# Patient Record
Sex: Female | Born: 1975 | Race: White | Hispanic: No | Marital: Single | Smoking: Current every day smoker
Health system: Southern US, Community
[De-identification: ages and names within clinical notes are randomized; demographics above are authoritative.]

## PROBLEM LIST (undated history)

## (undated) DIAGNOSIS — F419 Anxiety disorder, unspecified: Secondary | ICD-10-CM

## (undated) DIAGNOSIS — C801 Malignant (primary) neoplasm, unspecified: Secondary | ICD-10-CM

## (undated) DIAGNOSIS — M5030 Other cervical disc degeneration, unspecified cervical region: Secondary | ICD-10-CM

## (undated) DIAGNOSIS — M797 Fibromyalgia: Secondary | ICD-10-CM

## (undated) DIAGNOSIS — I10 Essential (primary) hypertension: Secondary | ICD-10-CM

## (undated) DIAGNOSIS — G5600 Carpal tunnel syndrome, unspecified upper limb: Secondary | ICD-10-CM

## (undated) HISTORY — PX: OTHER SURGICAL HISTORY: SHX169

## (undated) HISTORY — PX: CHOLECYSTECTOMY: SHX55

## (undated) HISTORY — PX: CERVICAL ABLATION: SHX5771

## (undated) HISTORY — PX: NECK SURGERY: SHX720

---

## 1998-01-09 ENCOUNTER — Emergency Department (HOSPITAL_COMMUNITY): Admission: EM | Admit: 1998-01-09 | Discharge: 1998-01-10 | Payer: Self-pay

## 1998-01-10 ENCOUNTER — Emergency Department (HOSPITAL_COMMUNITY): Admission: EM | Admit: 1998-01-10 | Discharge: 1998-01-10 | Payer: Self-pay | Admitting: Emergency Medicine

## 1998-01-10 ENCOUNTER — Ambulatory Visit (HOSPITAL_COMMUNITY): Admission: RE | Admit: 1998-01-10 | Discharge: 1998-01-10 | Payer: Self-pay

## 1998-01-11 ENCOUNTER — Observation Stay (HOSPITAL_COMMUNITY): Admission: RE | Admit: 1998-01-11 | Discharge: 1998-01-12 | Payer: Self-pay | Admitting: General Surgery

## 1998-02-27 ENCOUNTER — Emergency Department (HOSPITAL_COMMUNITY): Admission: EM | Admit: 1998-02-27 | Discharge: 1998-02-27 | Payer: Self-pay | Admitting: Emergency Medicine

## 1999-11-26 ENCOUNTER — Emergency Department (HOSPITAL_COMMUNITY): Admission: EM | Admit: 1999-11-26 | Discharge: 1999-11-26 | Payer: Self-pay | Admitting: Emergency Medicine

## 2001-02-02 ENCOUNTER — Emergency Department (HOSPITAL_COMMUNITY): Admission: EM | Admit: 2001-02-02 | Discharge: 2001-02-02 | Payer: Self-pay | Admitting: *Deleted

## 2001-12-31 ENCOUNTER — Emergency Department (HOSPITAL_COMMUNITY): Admission: EM | Admit: 2001-12-31 | Discharge: 2001-12-31 | Payer: Self-pay | Admitting: Emergency Medicine

## 2002-11-04 ENCOUNTER — Emergency Department (HOSPITAL_COMMUNITY): Admission: EM | Admit: 2002-11-04 | Discharge: 2002-11-04 | Payer: Self-pay | Admitting: Emergency Medicine

## 2002-11-04 ENCOUNTER — Encounter: Payer: Self-pay | Admitting: Emergency Medicine

## 2002-12-03 ENCOUNTER — Emergency Department (HOSPITAL_COMMUNITY): Admission: EM | Admit: 2002-12-03 | Discharge: 2002-12-04 | Payer: Self-pay | Admitting: Physical Therapy

## 2003-02-28 ENCOUNTER — Emergency Department (HOSPITAL_COMMUNITY): Admission: EM | Admit: 2003-02-28 | Discharge: 2003-02-28 | Payer: Self-pay | Admitting: Emergency Medicine

## 2003-03-05 ENCOUNTER — Encounter: Payer: Self-pay | Admitting: Emergency Medicine

## 2003-03-05 ENCOUNTER — Emergency Department (HOSPITAL_COMMUNITY): Admission: EM | Admit: 2003-03-05 | Discharge: 2003-03-05 | Payer: Self-pay | Admitting: Emergency Medicine

## 2004-03-12 ENCOUNTER — Emergency Department (HOSPITAL_COMMUNITY): Admission: EM | Admit: 2004-03-12 | Discharge: 2004-03-12 | Payer: Self-pay | Admitting: Emergency Medicine

## 2004-04-01 ENCOUNTER — Ambulatory Visit: Payer: Self-pay | Admitting: Family Medicine

## 2004-06-29 ENCOUNTER — Inpatient Hospital Stay (HOSPITAL_COMMUNITY): Admission: AD | Admit: 2004-06-29 | Discharge: 2004-06-29 | Payer: Self-pay | Admitting: Obstetrics and Gynecology

## 2004-07-02 ENCOUNTER — Ambulatory Visit: Payer: Self-pay | Admitting: Family Medicine

## 2004-07-04 ENCOUNTER — Ambulatory Visit: Payer: Self-pay | Admitting: Family Medicine

## 2004-07-18 ENCOUNTER — Ambulatory Visit: Payer: Self-pay | Admitting: Family Medicine

## 2004-07-18 ENCOUNTER — Ambulatory Visit (HOSPITAL_COMMUNITY): Admission: RE | Admit: 2004-07-18 | Discharge: 2004-07-18 | Payer: Self-pay | Admitting: Family Medicine

## 2004-07-21 ENCOUNTER — Ambulatory Visit: Payer: Self-pay | Admitting: Family Medicine

## 2004-07-24 ENCOUNTER — Ambulatory Visit: Payer: Self-pay | Admitting: Family Medicine

## 2004-10-01 ENCOUNTER — Ambulatory Visit: Payer: Self-pay | Admitting: Family Medicine

## 2004-10-29 ENCOUNTER — Ambulatory Visit: Payer: Self-pay | Admitting: Family Medicine

## 2008-03-07 ENCOUNTER — Ambulatory Visit: Payer: Self-pay | Admitting: Family Medicine

## 2008-03-07 DIAGNOSIS — N390 Urinary tract infection, site not specified: Secondary | ICD-10-CM

## 2008-03-07 LAB — CONVERTED CEMR LAB
Bilirubin Urine: NEGATIVE
Glucose, Urine, Semiquant: NEGATIVE
Ketones, urine, test strip: NEGATIVE
Protein, U semiquant: 30

## 2008-03-08 ENCOUNTER — Encounter (INDEPENDENT_AMBULATORY_CARE_PROVIDER_SITE_OTHER): Payer: Self-pay | Admitting: Family Medicine

## 2008-03-12 ENCOUNTER — Telehealth (INDEPENDENT_AMBULATORY_CARE_PROVIDER_SITE_OTHER): Payer: Self-pay | Admitting: *Deleted

## 2008-03-13 ENCOUNTER — Ambulatory Visit: Payer: Self-pay | Admitting: Family Medicine

## 2008-03-13 DIAGNOSIS — L255 Unspecified contact dermatitis due to plants, except food: Secondary | ICD-10-CM

## 2014-11-02 ENCOUNTER — Encounter (HOSPITAL_COMMUNITY): Payer: Self-pay | Admitting: Emergency Medicine

## 2014-11-02 ENCOUNTER — Emergency Department (HOSPITAL_COMMUNITY): Payer: Medicaid Other

## 2014-11-02 ENCOUNTER — Emergency Department (HOSPITAL_COMMUNITY)
Admission: EM | Admit: 2014-11-02 | Discharge: 2014-11-02 | Disposition: A | Discharge status: Home / Self Care | Payer: Medicaid Other | Attending: Emergency Medicine | Admitting: Emergency Medicine

## 2014-11-02 DIAGNOSIS — Z8669 Personal history of other diseases of the nervous system and sense organs: Secondary | ICD-10-CM | POA: Insufficient documentation

## 2014-11-02 DIAGNOSIS — M25562 Pain in left knee: Secondary | ICD-10-CM

## 2014-11-02 HISTORY — DX: Carpal tunnel syndrome, unspecified upper limb: G56.00

## 2014-11-02 HISTORY — DX: Fibromyalgia: M79.7

## 2014-11-02 MED ORDER — OXYCODONE-ACETAMINOPHEN 5-325 MG PO TABS
1.0000 | ORAL_TABLET | Freq: Once | ORAL | Status: AC
  Administered 2014-11-02: 1 via ORAL
  Filled 2014-11-02: qty 1

## 2014-11-02 MED ORDER — OXYCODONE-ACETAMINOPHEN 5-325 MG PO TABS: ORAL_TABLET | ORAL | Status: DC

## 2014-11-02 NOTE — ED Notes (Addendum)
Up to bathroom. Pt hopped to toilet.

## 2014-11-02 NOTE — ED Notes (Signed)
Pt c/o left knee pain x several days after getting down out of high truck

## 2014-11-02 NOTE — ED Notes (Signed)
Called xray to check when patient will go for films.   They advised she is next and someone should be here for her soon.

## 2014-11-02 NOTE — Discharge Instructions (Signed)
Take percocet for breakthrough pain, do not drink alcohol, drive, care for children or do other critical tasks while taking percocet. ° °Please follow with your primary care doctor in the next 2 days for a check-up. They must obtain records for further management.  ° °Do not hesitate to return to the Emergency Department for any new, worsening or concerning symptoms.  ° °

## 2014-11-02 NOTE — ED Provider Notes (Signed)
CSN: [id number]     Arrival date & time 11/02/14  1236 History  This chart was scribed for non-physician practitioner Monico Blitz, PA working with Davonna Belling, MD, by Eustaquio Maize, ED Scribe. This patient was seen in room TR11C/TR11C and the patient's care was started at 1:28 PM.     Chief Complaint  Patient presents with  . Knee Pain   The history is provided by the patient. No language interpreter was used.     HPI Comments: Tracey Lee is a 39 y.o. female who presents to the Emergency Department complaining of constant, severe left knee pain that began approximately 1 week ago. She rates the pain as an 8/10 on the pain scale.  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 The pain is exacerbated upon baring weight. Pt is able to bend knee without difficulty. She has been taking Tylenol and Ibuprofen without relief. She has also been elevating and applying ice to the area which she states has not alleviated pain. She mentions that she was sweating 1 day ago due to the severity in pain. Pt denies fever, chills, or any other symptoms.    Past Medical History  Diagnosis Date  . Fibromyalgia   . Carpal tunnel syndrome    History reviewed. No pertinent past surgical history. History reviewed. No pertinent family history. History  Substance Use Topics  . Smoking status: Never Smoker   . Smokeless tobacco: Not on file  . Alcohol Use: No   OB History    No data available     Review of Systems  A complete 10 system review of systems was obtained and all systems are negative except as noted in the HPI and PMH.    Allergies  Review of patient's allergies indicates no known allergies.  Home Medications   Prior to Admission medications   Medication Sig Start Date End Date Taking? Authorizing Provider  oxyCODONE-acetaminophen (PERCOCET/ROXICET) 5-325 MG  per tablet 1 to 2 tabs PO q6hrs  PRN for pain 11/02/14   Monico Blitz, PA-C   Triage Vitals: BP 138/82 mmHg  Pulse 75  Temp(Src) 98.1 F (36.7 C) (Oral)  Resp 20  SpO2 97%   Physical Exam  Constitutional: She is oriented to person, place, and time. She appears well-developed and well-nourished. No distress.  HENT:  Head: Normocephalic.  Eyes: Conjunctivae and EOM are normal.  Cardiovascular: Normal rate.   Pulmonary/Chest: Effort normal. No stridor.  Musculoskeletal: Normal range of motion.  Left knee:  No deformity, erythema or abrasions. FROM. No effusion or crepitance. Anterior and posterior drawer show no abnormal laxity. Stable to valgus and varus stress. Joint lines are non-tender. Neurovascularly intact. Pt ambulates with antalgic gait.    Neurological: She is alert and oriented to person, place, and time.  Psychiatric: She has a normal mood and affect.  Nursing note and vitals reviewed.   ED Course  Procedures (including critical care time)  DIAGNOSTIC STUDIES: Oxygen Saturation is 97% on RA, normal by my interpretation.    COORDINATION OF CARE: 1:30 PM-Discussed treatment plan which includes DG L Knee, crutches, and knee immobilizer with pt at bedside and pt agreed to plan.   Labs Review Labs Reviewed - No data to display  Imaging Review Dg Knee Complete 4 Views Left  11/02/2014   CLINICAL DATA:  Left knee injury approximately 1 week  ago when the patient jumped down from a truck bed. Pain and swelling which began approximately 2 days ago. Initial encounter.  EXAM: LEFT KNEE - COMPLETE 4+ VIEW  COMPARISON:  None.  FINDINGS: No evidence of acute or subacute fracture or dislocation. Mild medial compartment joint space narrowing and associated hypertrophic spurring. Patellofemoral and lateral compartment joint spaces well preserved. Minimal enthesopathic spurring at the insertion of the patellar tendon on the inferior patella. No visible joint effusion.  IMPRESSION: No  acute or subacute osseous abnormality. Mild medial compartment osteoarthritis.   Electronically Signed   By: Evangeline Dakin M.D.   On: 11/02/2014 15:03     EKG Interpretation None      MDM   Final diagnoses:  Left knee pain    Filed Vitals:   11/02/14 1252 11/02/14 1533  BP: 138/82 108/46  Pulse: 75 59  Temp: 98.1 F (36.7 C)   TempSrc: Oral   Resp: 20 18  SpO2: 97% 100%    Medications  oxyCODONE-acetaminophen (PERCOCET/ROXICET) 5-325 MG per tablet 1 tablet (1 tablet Oral Given 11/02/14 1343)    Tracey Lee is a pleasant 39 y.o. female presenting with atraumatic knee pain. Neurovascularly intact with mild tenderness to palpation. X-rays negative. Will treat with rest, ice, compression elevation, NSAIDS. Patient given crutches and an orthopedic follow-up. Patient given crutches and knee immobilizer.  Evaluation does not show pathology that would require ongoing emergent intervention or inpatient treatment. Pt is hemodynamically stable and mentating appropriately. Discussed findings and plan with patient/guardian, who agrees with care plan. All questions answered. Return precautions discussed and outpatient follow up given.   Discharge Medication List as of 11/02/2014  3:17 PM    START taking these medications   Details  oxyCODONE-acetaminophen (PERCOCET/ROXICET) 5-325 MG per tablet 1 to 2 tabs PO q6hrs  PRN for pain, Print         I personally performed the services described in this documentation, which was scribed in my presence. The recorded information has been reviewed and is accurate.      Monico Blitz, PA-C 11/02/14 2313  Davonna Belling, MD 11/05/14 [phone]

## 2014-11-20 ENCOUNTER — Inpatient Hospital Stay (HOSPITAL_COMMUNITY)
Admission: AD | Admit: 2014-11-20 | Discharge: 2014-11-20 | Disposition: A | Discharge status: Home / Self Care | Payer: Self-pay | Source: Ambulatory Visit | Attending: Family Medicine | Admitting: Family Medicine

## 2014-11-20 ENCOUNTER — Encounter (HOSPITAL_COMMUNITY): Payer: Self-pay | Admitting: *Deleted

## 2014-11-20 ENCOUNTER — Encounter (HOSPITAL_COMMUNITY): Payer: Self-pay | Admitting: Emergency Medicine

## 2014-11-20 DIAGNOSIS — I10 Essential (primary) hypertension: Secondary | ICD-10-CM | POA: Insufficient documentation

## 2014-11-20 DIAGNOSIS — Z8739 Personal history of other diseases of the musculoskeletal system and connective tissue: Secondary | ICD-10-CM | POA: Insufficient documentation

## 2014-11-20 DIAGNOSIS — Y9389 Activity, other specified: Secondary | ICD-10-CM | POA: Insufficient documentation

## 2014-11-20 DIAGNOSIS — Y92009 Unspecified place in unspecified non-institutional (private) residence as the place of occurrence of the external cause: Secondary | ICD-10-CM | POA: Insufficient documentation

## 2014-11-20 DIAGNOSIS — S01311A Laceration without foreign body of right ear, initial encounter: Secondary | ICD-10-CM

## 2014-11-20 DIAGNOSIS — W458XXA Other foreign body or object entering through skin, initial encounter: Secondary | ICD-10-CM | POA: Insufficient documentation

## 2014-11-20 DIAGNOSIS — Y998 Other external cause status: Secondary | ICD-10-CM | POA: Insufficient documentation

## 2014-11-20 DIAGNOSIS — X58XXXA Exposure to other specified factors, initial encounter: Secondary | ICD-10-CM | POA: Insufficient documentation

## 2014-11-20 DIAGNOSIS — S01321A Laceration with foreign body of right ear, initial encounter: Secondary | ICD-10-CM | POA: Insufficient documentation

## 2014-11-20 DIAGNOSIS — Z8659 Personal history of other mental and behavioral disorders: Secondary | ICD-10-CM | POA: Insufficient documentation

## 2014-11-20 NOTE — Discharge Instructions (Signed)
Facial Laceration °A facial laceration is a cut on the face. These injuries can be painful and cause bleeding. Some cuts may need to be closed with stitches (sutures), skin adhesive strips, or wound glue. Cuts usually heal quickly but can leave a scar. It can take 1-2 years for the scar to go away completely. °HOME CARE  °· Only take medicines as told by your doctor. °· Follow your doctor's instructions for wound care. °For Stitches: °· Keep the cut clean and dry. °· If you have a bandage (dressing), change it at least once a day. Change the bandage if it gets wet or dirty, or as told by your doctor. °· Wash the cut with soap and water 2 times a day. Rinse the cut with water. Pat it dry with a clean towel. °· Put a thin layer of medicated cream on the cut as told by your doctor. °· You may shower after the first 24 hours. Do not soak the cut in water until the stitches are removed. °· Have your stitches removed as told by your doctor. °· Do not wear any makeup until a few days after your stitches are removed. °For Skin Adhesive Strips: °· Keep the cut clean and dry. °· Do not get the strips wet. You may take a bath, but be careful to keep the cut dry. °· If the cut gets wet, pat it dry with a clean towel. °· The strips will fall off on their own. Do not remove the strips that are still stuck to the cut. °For Wound Glue: °· You may shower or take baths. Do not soak or scrub the cut. Do not swim. Avoid heavy sweating until the glue falls off on its own. After a shower or bath, pat the cut dry with a clean towel. °· Do not put medicine or makeup on your cut until the glue falls off. °· If you have a bandage, do not put tape over the glue. °· Avoid lots of sunlight or tanning lamps until the glue falls off. °· The glue will fall off on its own in 5-10 days. Do not pick at the glue. °After Healing: °Put sunscreen on the cut for the first year to reduce your scar. °GET HELP RIGHT AWAY IF:  °· Your cut area gets red,  painful, or puffy (swollen). °· You see a yellowish-white fluid (pus) coming from the cut. °· You have chills or a fever. °MAKE SURE YOU:  °· Understand these instructions. °· Will watch your condition. °· Will get help right away if you are not doing well or get worse. °Document Released: 12/16/2007 Document Revised: 04/19/2013 Document Reviewed: 02/09/2013 °[employer]® Patient Information ©2015 [employer]. This information is not intended to replace advice given to you by your health care provider. Make sure you discuss any questions you have with your health care provider. ° °

## 2014-11-20 NOTE — MAU Provider Note (Signed)
Ms. Tracey Lee is a 39 y.o. [phone] who presents to [facility]U today with complaint of a laceration of the right ear lobe. The patient states that she was moving her hair away from her ear and it got caught in her earring and pulled the earring which "ripped" her earring out. She states moderate to severe pain. She put neosporin on the area which caused some pain as well. She states significant bleeding noted at first which has now resolved. Patient denies fever or any other concerns.   ROS:  All pertinent ROS noted in HPI, all other ROS negative  BP 127/85 mmHg  Pulse 84  Temp(Src) 98.6 F (37 C) (Oral)  Resp 20  Ht 5\' 4"  (1.626 m)  Wt 279 lb 6 oz (126.724 kg)  BMI 47.93 kg/m2  CONSTITUTIONAL: Well-developed, well-nourished female in no acute distress.  ENT: External right and left ear normal.  EYES: EOM intact, conjunctivae normal.  MUSCULOSKELETAL: Normal range of motion.  CARDIOVASCULAR: Regular heart rate RESPIRATORY: Normal effort NEUROLOGICAL: Alert and oriented to person, place, and time.  SKIN: Skin is warm and dry. No rash noted. Not diaphoretic. No erythema. No pallor. PSYCH: Normal mood and affect. Normal behavior. Normal judgment and thought content.  A: Laceration of the right earlobe  P: Discharge patient in stable condition Patient advised to follow-up with Urgent Care, [facility] or [facility] for further evaluation and possible suturing of the laceration if they deem it necessary  Luvenia Redden, PA-C 11/20/2014 9:28 PM

## 2014-11-20 NOTE — ED Notes (Signed)
Pt. presents with laceration at right earlobe sustained this evening after her earring accidentally pulled at home . Bleeding controlled.

## 2014-11-20 NOTE — MAU Note (Addendum)
PT SAYS    AT 630PM-  HER HAIR  GOT CAUGHT IN HER HOOP EARRING-    AND  WHILE  UNTANGLING-    -IT RIPPED  HER EARLOBE  COMPLETELY THROUGH.    ON ARRIVAL-   NO BLEEDING.

## 2014-11-21 ENCOUNTER — Emergency Department (HOSPITAL_COMMUNITY)
Admission: EM | Admit: 2014-11-21 | Discharge: 2014-11-21 | Disposition: A | Discharge status: Home / Self Care | Payer: Medicaid Other | Attending: Emergency Medicine | Admitting: Emergency Medicine

## 2014-11-21 DIAGNOSIS — S01311A Laceration without foreign body of right ear, initial encounter: Secondary | ICD-10-CM

## 2014-11-21 HISTORY — DX: Essential (primary) hypertension: I10

## 2014-11-21 HISTORY — DX: Anxiety disorder, unspecified: F41.9

## 2014-11-21 MED ORDER — LIDOCAINE HCL (PF) 1 % IJ SOLN
10.0000 mL | Freq: Once | INTRAMUSCULAR | Status: AC
  Administered 2014-11-21: 10 mL via INTRADERMAL
  Filled 2014-11-21: qty 10

## 2014-11-21 NOTE — Discharge Instructions (Signed)
1. Medications: Tylenol or ibuprofen for pain, usual home medications 2. Treatment: ice for swelling, keep wound clean with warm soap and water and keep bandage dry, do not submerge in water for 24 hours 3. Follow Up: Please see your PCP in 5 days for wound check or sooner if you have concerns. Return to the emergency department for increased redness, drainage of pus from the wound   WOUND CARE  Keep area clean and dry for 24 hours. Do not remove bandage, if applied.  After 24 hours, remove bandage and wash wound gently with mild soap and warm water. Reapply a new bandage after cleaning wound, if directed.   Continue daily cleansing with soap and water until stitches/staples are removed.  Do not apply any ointments or creams to the wound while stitches/staples are in place, as this may cause delayed healing. Return if you experience any of the following signs of infection: Swelling, redness, pus drainage, streaking, fever >101.0 F  Return if you experience excessive bleeding that does not stop after 15-20 minutes of constant, firm pressure.

## 2014-11-21 NOTE — ED Provider Notes (Signed)
CSN: [id number]     Arrival date & time 11/20/14  2322 History   First MD Initiated Contact with Patient 11/21/14 0018     Chief Complaint  Patient presents with  . Ear Laceration     (Consider location/radiation/quality/duration/timing/severity/associated sxs/prior Treatment) The history is provided by the patient and medical records. No language interpreter was used.     Tracey Lee is a 39 y.o. female  with a hx of fibromyalgia, HTN, anxiety presents to the Emergency Department complaining of acute, persistent laceration to the right ear. Patient reports she was wearing hoop earrings tonight when her hair got wrapped around it and she accidentally ripped out of her ear. Patient reports this occurred around 6 PM tonight.  She reports that she cleaned the wound and the bleeding stopped however an OB/GYN told her that she needed sutures. She denies fever, chills, nausea, vomiting.  Eyes history of steroid uses, poor wound healing or diabetes.    Past Medical History  Diagnosis Date  . Fibromyalgia   . Carpal tunnel syndrome   . Hypertension   . Anxiety    Past Surgical History  Procedure Laterality Date  . Cholecystectomy     No family history on file. History  Substance Use Topics  . Smoking status: Never Smoker   . Smokeless tobacco: Not on file  . Alcohol Use: No   OB History    Gravida Para Term Preterm AB TAB SAB Ectopic Multiple Living   4 2 2  1  1   3      Review of Systems  Constitutional: Negative for fever.  Gastrointestinal: Negative for nausea and vomiting.  Skin: Positive for wound.  Allergic/Immunologic: Negative for immunocompromised state.  Neurological: Negative for weakness and numbness.  Hematological: Does not bruise/bleed easily.  Psychiatric/Behavioral: The patient is not nervous/anxious.       Allergies  Review of patient's allergies indicates no known allergies.  Home Medications   Prior to Admission medications   Medication Sig  Start Date End Date Taking? Authorizing Provider  diphenhydrAMINE (BENADRYL) 25 MG tablet Take 25 mg by mouth daily as needed for allergies.    Historical Provider, MD  ibuprofen (ADVIL,MOTRIN) 200 MG tablet Take 600 mg by mouth every 6 (six) hours as needed for mild pain.    Historical Provider, MD  Multiple Vitamin (MULTIVITAMIN WITH MINERALS) TABS tablet Take 1 tablet by mouth daily.    Historical Provider, MD  oxyCODONE-acetaminophen (PERCOCET/ROXICET) 5-325 MG per tablet 1 to 2 tabs PO q6hrs  PRN for pain Patient taking differently: Take 1-2 tablets by mouth every 6 (six) hours as needed for moderate pain.  11/02/14   Nicole Pisciotta, PA-C   BP 149/91 mmHg  Pulse 97  Temp(Src) 98.2 F (36.8 C) (Oral)  Resp 14  Ht 5\' 5"  (1.651 m)  Wt 280 lb (127.007 kg)  BMI 46.59 kg/m2  SpO2 99% Physical Exam  Constitutional: She is oriented to person, place, and time. She appears well-developed and well-nourished. No distress.  HENT:  Head: Normocephalic and atraumatic.  0.75 cm laceration to the right ear lobe, hemostasis achieved -  Eyes: Conjunctivae are normal. No scleral icterus.  Neck: Normal range of motion.  Cardiovascular: Normal rate, regular rhythm, normal heart sounds and intact distal pulses.   No murmur heard. Capillary refill < 3 sec  Pulmonary/Chest: Effort normal and breath sounds normal. No respiratory distress.  Musculoskeletal: Normal range of motion. She exhibits no edema.  Neurological: She is alert and  oriented to person, place, and time.  Skin: Skin is warm and dry. She is not diaphoretic.  Psychiatric: She has a normal mood and affect.  Nursing note and vitals reviewed.   ED Course  LACERATION REPAIR Date/Time: 11/21/2014 1:23 AM Performed by: Abigail Butts Authorized by: Abigail Butts Consent: Verbal consent obtained. Risks and benefits: risks, benefits and alternatives were discussed Consent given by: patient Patient understanding: patient states  understanding of the procedure being performed Patient consent: the patient's understanding of the procedure matches consent given Procedure consent: procedure consent matches procedure scheduled Relevant documents: relevant documents present and verified Site marked: the operative site was marked Required items: required blood products, implants, devices, and special equipment available Patient identity confirmed: verbally with patient and arm band Time out: Immediately prior to procedure a "time out" was called to verify the correct patient, procedure, equipment, support staff and site/side marked as required. Body area: head/neck Location details: right ear Laceration length: 0.8 cm Foreign bodies: no foreign bodies Tendon involvement: none Nerve involvement: none Vascular damage: no Anesthesia: local infiltration Local anesthetic: lidocaine 1% without epinephrine Anesthetic total: 2 ml Patient sedated: no Preparation: Patient was prepped and draped in the usual sterile fashion. Irrigation solution: saline Irrigation method: syringe Amount of cleaning: extensive Debridement: none Degree of undermining: none Wound skin closure material used: 4-0 chronic gut. Number of sutures: 3 Technique: simple Approximation: close Dressing: 4x4 sterile gauze Patient tolerance: Patient tolerated the procedure well with no immediate complications   (including critical care time) Labs Review Labs Reviewed - No data to display  Imaging Review No results found.   EKG Interpretation None      MDM   Final diagnoses:  Laceration of earlobe, right, initial encounter   Tracey Lee presents with laceration to the right earlobe after pulling out her earring. Initial pierced hole is well healed and only a small section was lacerated. Discussed that this does not usually require sutures however patient requested sutures be placed in an attempt to cosmetically repair the tear.  His  custom length that I do not believe this will improve healing however we will place sutures.  Tetanus updated. Sutures placed without difficulty. Patient discharged home with wound care constructions.  BP 149/91 mmHg  Pulse 97  Temp(Src) 98.2 F (36.8 C) (Oral)  Resp 14  Ht 5\' 5"  (1.651 m)  Wt 280 lb (127.007 kg)  BMI 46.59 kg/m2  SpO2 99%     Abigail Butts, PA-C 11/21/14 0128  Linton Flemings, MD 11/21/14 [phone]

## 2014-11-21 NOTE — ED Notes (Addendum)
Pt sts she woke up on the couch tonight and was wearing a pair of hoop earrings.  Pt sts her hair got wrapped around it and pt was still sleepy attempting to pull the hair out of the earring and ripped the earring out of her ear.  Pt noted blood and realized she had split her earlobe from the piercing hole to the tip of the lobe.

## 2015-08-31 ENCOUNTER — Encounter (HOSPITAL_COMMUNITY): Payer: Self-pay | Admitting: Emergency Medicine

## 2015-08-31 ENCOUNTER — Emergency Department (HOSPITAL_COMMUNITY)
Admission: EM | Admit: 2015-08-31 | Discharge: 2015-08-31 | Disposition: A | Discharge status: Home / Self Care | Payer: Self-pay | Attending: Emergency Medicine | Admitting: Emergency Medicine

## 2015-08-31 ENCOUNTER — Emergency Department (HOSPITAL_COMMUNITY): Payer: Self-pay

## 2015-08-31 DIAGNOSIS — Z79899 Other long term (current) drug therapy: Secondary | ICD-10-CM | POA: Insufficient documentation

## 2015-08-31 DIAGNOSIS — Y990 Civilian activity done for income or pay: Secondary | ICD-10-CM | POA: Insufficient documentation

## 2015-08-31 DIAGNOSIS — Z8669 Personal history of other diseases of the nervous system and sense organs: Secondary | ICD-10-CM | POA: Insufficient documentation

## 2015-08-31 DIAGNOSIS — M1711 Unilateral primary osteoarthritis, right knee: Secondary | ICD-10-CM | POA: Insufficient documentation

## 2015-08-31 DIAGNOSIS — W228XXA Striking against or struck by other objects, initial encounter: Secondary | ICD-10-CM | POA: Insufficient documentation

## 2015-08-31 DIAGNOSIS — I10 Essential (primary) hypertension: Secondary | ICD-10-CM | POA: Insufficient documentation

## 2015-08-31 DIAGNOSIS — Y9389 Activity, other specified: Secondary | ICD-10-CM | POA: Insufficient documentation

## 2015-08-31 DIAGNOSIS — Y9289 Other specified places as the place of occurrence of the external cause: Secondary | ICD-10-CM | POA: Insufficient documentation

## 2015-08-31 DIAGNOSIS — M25561 Pain in right knee: Secondary | ICD-10-CM

## 2015-08-31 DIAGNOSIS — S8001XA Contusion of right knee, initial encounter: Secondary | ICD-10-CM | POA: Insufficient documentation

## 2015-08-31 DIAGNOSIS — Z8659 Personal history of other mental and behavioral disorders: Secondary | ICD-10-CM | POA: Insufficient documentation

## 2015-08-31 DIAGNOSIS — E669 Obesity, unspecified: Secondary | ICD-10-CM | POA: Insufficient documentation

## 2015-08-31 DIAGNOSIS — S86911A Strain of unspecified muscle(s) and tendon(s) at lower leg level, right leg, initial encounter: Secondary | ICD-10-CM | POA: Insufficient documentation

## 2015-08-31 MED ORDER — NAPROXEN 500 MG PO TABS: 500.0000 mg | ORAL_TABLET | Freq: Two times a day (BID) | ORAL | Status: DC | PRN

## 2015-08-31 MED ORDER — HYDROCODONE-ACETAMINOPHEN 5-325 MG PO TABS: 1.0000 | ORAL_TABLET | Freq: Four times a day (QID) | ORAL | Status: DC | PRN

## 2015-08-31 MED ORDER — HYDROCODONE-ACETAMINOPHEN 5-325 MG PO TABS
1.0000 | ORAL_TABLET | Freq: Once | ORAL | Status: AC
  Administered 2015-08-31: 1 via ORAL
  Filled 2015-08-31: qty 1

## 2015-08-31 NOTE — ED Notes (Signed)
Pt reports right knee pain , denies fall nor injury. Hx arthritis and left knee issues in past.

## 2015-08-31 NOTE — Discharge Instructions (Signed)
Wear knee immobilizer/sleeve for at least 2 weeks for stabilization of knee. Use crutches as needed for comfort. Ice and elevate knee throughout the day. Alternate between naprosyn and norco for pain relief. Do not drive or operate machinery with pain medication use. Losing weight will also help your knee pain. Call orthopedic follow up today or tomorrow to schedule followup appointment for recheck of ongoing knee pain in 1-2 weeks that can be canceled with a 24-48 hour notice if complete resolution of pain. Return to the ER for changes or worsening symptoms.    Knee Pain Knee pain is a common problem. It can have many causes. The pain often goes away by following your doctor's home care instructions. Treatment for ongoing pain will depend on the cause of your pain. If your knee pain continues, more tests may be needed to diagnose your condition. Tests may include X-rays or other imaging studies of your knee. HOME CARE  Take medicines only as told by your doctor.  Rest your knee and keep it raised (elevated) while you are resting.  Do not do things that cause pain or make your pain worse.  Avoid activities where both feet leave the ground at the same time, such as running, jumping rope, or doing jumping jacks.  Apply ice to the knee area:  Put ice in a plastic bag.  Place a towel between your skin and the bag.  Leave the ice on for 20 minutes, 2-3 times a day.  Ask your doctor if you should wear an elastic knee support.  Sleep with a pillow under your knee.  Lose weight if you are overweight. Being overweight can make your knee hurt more.  Do not use any tobacco products, including cigarettes, chewing tobacco, or electronic cigarettes. If you need help quitting, ask your doctor. Smoking may slow the healing of any bone and joint problems that you may have. GET HELP IF:  Your knee pain does not stop, it changes, or it gets worse.  You have a fever along with knee pain.  Your knee  gives out or locks up.  Your knee becomes more swollen. GET HELP RIGHT AWAY IF:   Your knee feels hot to the touch.  You have chest pain or trouble breathing.   This information is not intended to replace advice given to you by your health care provider. Make sure you discuss any questions you have with your health care provider.   Document Released: 09/25/2008 Document Revised: 07/20/2014 Document Reviewed: 08/30/2013 Elsevier Interactive Patient Education 2016 Else[employer] Inc[employer]   Osteoarthritis Osteoarthritis is a disease that causes soreness and inflammation of a joint. It occurs when the cartilage at the affected joint wears down. Cartilage acts as a cushion, covering the ends of bones where they meet to form a joint. Osteoarthritis is the most common form of arthritis. It often occurs in older people. The joints affected most often by this condition include those in the:  Ends of the fingers.  Thumbs.  Neck.  Lower back.  Knees.  Hips. CAUSES  Over time, the cartilage that covers the ends of bones begins to wear away. This causes bone to rub on bone, producing pain and stiffness in the affected joints.  RISK FACTORS Certain factors can increase your chances of having osteoarthritis, including:  Older age.  Excessive body weight.  Overuse of joints.  Previous joint injury. SIGNS AND SYMPTOMS   Pain, swelling, and stiffness in the joint.  Over time, the joint may lose  its normal shape.  Small deposits of bone (osteophytes) may grow on the edges of the joint.  Bits of bone or cartilage can break off and float inside the joint space. This may cause more pain and damage. DIAGNOSIS  Your health care provider will do a physical exam and ask about your symptoms. Various tests may be ordered, such as:  X-rays of the affected joint.  Blood tests to rule out other types of arthritis. Additional tests may be used to diagnose your condition. TREATMENT  Goals of  treatment are to control pain and improve joint function. Treatment plans may include:  A prescribed exercise program that allows for rest and joint relief.  A weight control plan.  Pain relief techniques, such as:  Properly applied heat and cold.  Electric pulses delivered to nerve endings under the skin (transcutaneous electrical nerve stimulation [TENS]).  Massage.  Certain nutritional supplements.  Medicines to control pain, such as:  Acetaminophen.  Nonsteroidal anti-inflammatory drugs (NSAIDs), such as naproxen.  Narcotic or central-acting agents, such as tramadol.  Corticosteroids. These can be given orally or as an injection.  Surgery to reposition the bones and relieve pain (osteotomy) or to remove loose pieces of bone and cartilage. Joint replacement may be needed in advanced states of osteoarthritis. HOME CARE INSTRUCTIONS   Take medicines only as directed by your health care provider.  Maintain a healthy weight. Follow your health care provider's instructions for weight control. This may include dietary instructions.  Exercise as directed. Your health care provider can recommend specific types of exercise. These may include:  Strengthening exercises. These are done to strengthen the muscles that support joints affected by arthritis. They can be performed with weights or with exercise bands to add resistance.  Aerobic activities. These are exercises, such as brisk walking or low-impact aerobics, that get your heart pumping.  Range-of-motion activities. These keep your joints limber.  Balance and agility exercises. These help you maintain daily living skills.  Rest your affected joints as directed by your health care provider.  Keep all follow-up visits as directed by your health care provider. SEEK MEDICAL CARE IF:   Your skin turns red.  You develop a rash in addition to your joint pain.  You have worsening joint pain.  You have a fever along with  joint or muscle aches. SEEK IMMEDIATE MEDICAL CARE IF:  You have a significant loss of weight or appetite.  You have night sweats. [place] of Arthritis and Musculoskeletal and Skin Diseases: www.niams.SouthExposed.es  [employer] on Aging: http://kim-miller.com/  American College of Rheumatology: www.rheumatology.org   This information is not intended to replace advice given to you by your health care provider. Make sure you discuss any questions you have with your health care provider.   Document Released: 06/29/2005 Document Revised: 07/20/2014 Document Reviewed: 03/06/2013 Elsevier Interactive Patient Education 2016 [place] A contusion is a deep bruise. Contusions are the result of a blunt injury to tissues and muscle fibers under the skin. The injury causes bleeding under the skin. The skin overlying the contusion may turn blue, purple, or yellow. Minor injuries will give you a painless contusion, but more severe contusions may stay painful and swollen for a few weeks.  CAUSES  This condition is usually caused by a blow, trauma, or direct force to an area of the body. SYMPTOMS  Symptoms of this condition include:  Swelling of the injured area.  Pain and tenderness in the injured  area.  Discoloration. The area may have redness and then turn blue, purple, or yellow. DIAGNOSIS  This condition is diagnosed based on a physical exam and medical history. An X-ray, CT scan, or MRI may be needed to determine if there are any associated injuries, such as broken bones (fractures). TREATMENT  Specific treatment for this condition depends on what area of the body was injured. In general, the best treatment for a contusion is resting, icing, applying pressure to (compression), and elevating the injured area. This is often called the RICE strategy. Over-the-counter anti-inflammatory medicines may also be recommended for pain control.  HOME CARE  INSTRUCTIONS   Rest the injured area.  If directed, apply ice to the injured area:  Put ice in a plastic bag.  Place a towel between your skin and the bag.  Leave the ice on for 20 minutes, 2-3 times per day.  If directed, apply light compression to the injured area using an elastic bandage. Make sure the bandage is not wrapped too tightly. Remove and reapply the bandage as directed by your health care provider.  If possible, raise (elevate) the injured area above the level of your heart while you are sitting or lying down.  Take over-the-counter and prescription medicines only as told by your health care provider. SEEK MEDICAL CARE IF:  Your symptoms do not improve after several days of treatment.  Your symptoms get worse.  You have difficulty moving the injured area. SEEK IMMEDIATE MEDICAL CARE IF:   You have severe pain.  You have numbness in a hand or foot.  Your hand or foot turns pale or cold.   This information is not intended to replace advice given to you by your health care provider. Make sure you discuss any questions you have with your health care provider.   Document Released: 04/08/2005 Document Revised: 03/20/2015 Document Reviewed: 11/14/2014 Elsevier Interactive Patient Education 2016 [place].  Cryotherapy Cryotherapy means treatment with cold. Ice or gel packs can be used to reduce both pain and swelling. Ice is the most helpful within the first 24 to 48 hours after an injury or flare-up from overusing a muscle or joint. Sprains, strains, spasms, burning pain, shooting pain, and aches can all be eased with ice. Ice can also be used when recovering from surgery. Ice is effective, has very few side effects, and is safe for most people to use. PRECAUTIONS  Ice is not a safe treatment option for people with:  Raynaud phenomenon. This is a condition affecting small blood vessels in the extremities. Exposure to cold may cause your problems to  return.  Cold hypersensitivity. There are many forms of cold hypersensitivity, including:  Cold urticaria. Red, itchy hives appear on the skin when the tissues begin to warm after being iced.  Cold erythema. This is a red, itchy rash caused by exposure to cold.  Cold hemoglobinuria. Red blood cells break down when the tissues begin to warm after being iced. The hemoglobin that carry oxygen are passed into the urine because they cannot combine with blood proteins fast enough.  Numbness or altered sensitivity in the area being iced. If you have any of the following conditions, do not use ice until you have discussed cryotherapy with your caregiver:  Heart conditions, such as arrhythmia, angina, or chronic heart disease.  High blood pressure.  Healing wounds or open skin in the area being iced.  Current infections.  Rheumatoid arthritis.  Poor circulation.  Diabetes. Ice slows the blood flow  in the region it is applied. This is beneficial when trying to stop inflamed tissues from spreading irritating chemicals to surrounding tissues. However, if you expose your skin to cold temperatures for too long or without the proper protection, you can damage your skin or nerves. Watch for signs of skin damage due to cold. HOME CARE INSTRUCTIONS Follow these tips to use ice and cold packs safely.  Place a dry or damp towel between the ice and skin. A damp towel will cool the skin more quickly, so you may need to shorten the time that the ice is used.  For a more rapid response, add gentle compression to the ice.  Ice for no more than 10 to 20 minutes at a time. The bonier the area you are icing, the less time it will take to get the benefits of ice.  Check your skin after 5 minutes to make sure there are no signs of a poor response to cold or skin damage.  Rest 20 minutes or more between uses.  Once your skin is numb, you can end your treatment. You can test numbness by very lightly touching  your skin. The touch should be so light that you do not see the skin dimple from the pressure of your fingertip. When using ice, most people will feel these normal sensations in this order: cold, burning, aching, and numbness.  Do not use ice on someone who cannot communicate their responses to pain, such as small children or people with dementia. HOW TO MAKE AN ICE PACK Ice packs are the most common way to use ice therapy. Other methods include ice massage, ice baths, and cryosprays. Muscle creams that cause a cold, tingly feeling do not offer the same benefits that ice offers and should not be used as a substitute unless recommended by your caregiver. To make an ice pack, do one of the following:  Place crushed ice or a bag of frozen vegetables in a sealable plastic bag. Squeeze out the excess air. Place this bag inside another plastic bag. Slide the bag into a pillowcase or place a damp towel between your skin and the bag.  Mix 3 parts water with 1 part rubbing alcohol. Freeze the mixture in a sealable plastic bag. When you remove the mixture from the freezer, it will be slushy. Squeeze out the excess air. Place this bag inside another plastic bag. Slide the bag into a pillowcase or place a damp towel between your skin and the bag. SEEK MEDICAL CARE IF:  You develop white spots on your skin. This may give the skin a blotchy (mottled) appearance.  Your skin turns blue or pale.  Your skin becomes waxy or hard.  Your swelling gets worse. MAKE SURE YOU:   Understand these instructions.  Will watch your condition.  Will get help right away if you are not doing well or get worse.   This information is not intended to replace advice given to you by your health care provider. Make sure you discuss any questions you have with your health care provider.   Document Released: 02/23/2011 Document Revised: 07/20/2014 Document Reviewed: 02/23/2011 Elsevier Interactive Patient Education 2016  [employer].  Obesity Obesity is defined as having too much total body fat and a body mass index (BMI) of 30 or more. BMI is an estimate of body fat and is calculated from your height and weight. BMI is typically calculated by your health care provider during regular wellness visits. Obesity happens when you  consume more calories than you can burn by exercising or performing daily physical tasks. Prolonged obesity can cause major illnesses or emergencies, such as:  Stroke.  Heart disease.  Diabetes.  Cancer.  Arthritis.  High blood pressure (hypertension).  High cholesterol.  Sleep apnea.  Erectile dysfunction.  Infertility problems. CAUSES   Regularly eating unhealthy foods.  Physical inactivity.  Certain disorders, such as an underactive thyroid (hypothyroidism), Cushing's syndrome, and polycystic ovarian syndrome.  Certain medicines, such as steroids, some depression medicines, and antipsychotics.  Genetics.  Lack of sleep. DIAGNOSIS A health care provider can diagnose obesity after calculating your BMI. Obesity will be diagnosed if your BMI is 30 or higher. There are other methods of measuring obesity levels. Some other methods include measuring your skinfold thickness, your waist circumference, and comparing your hip circumference to your waist circumference. TREATMENT  A healthy treatment program includes some or all of the following:  Long-term dietary changes.  Exercise and physical activity.  Behavioral and lifestyle changes.  Medicine only under the supervision of your health care provider. Medicines may help, but only if they are used with diet and exercise programs. If your BMI is 40 or higher, your health care provider may recommend specialized surgery or programs to help with weight loss. An unhealthy treatment program includes:  Fasting.  Fad diets.  Supplements and drugs. These choices do not succeed in long-term weight control. HOME CARE  INSTRUCTIONS  Exercise and perform physical activity as directed by your health care provider. To increase physical activity, try the following:  Use stairs instead of elevators.  Park farther away from store entrances.  Garden, bike, or walk instead of watching television or using the computer.  Eat healthy, low-calorie foods and drinks on a regular basis. Eat more fruits and vegetables. Use low-calorie cookbooks or take healthy cooking classes.  Limit fast food, sweets, and processed snack foods.  Eat smaller portions.  Keep a daily journal of everything you eat. There are many free websites to help you with this. It may be helpful to measure your foods so you can determine if you are eating the correct portion sizes.  Avoid drinking alcohol. Drink more water and drinks without calories.  Take vitamins and supplements only as recommended by your health care provider.  Weight-loss support groups, Tax adviser, counselors, and stress reduction education can also be very helpful. SEEK IMMEDIATE MEDICAL CARE IF:  You have chest pain or tightness.  You have trouble breathing or feel short of breath.  You have weakness or leg numbness.  You feel confused or have trouble talking.  You have sudden changes in your vision.   This information is not intended to replace advice given to you by your health care provider. Make sure you discuss any questions you have with your health care provider.   Document Released: 08/06/2004 Document Revised: 07/20/2014 Document Reviewed: 08/05/2011 Elsevier Interactive Patient Education [employer].

## 2015-08-31 NOTE — Progress Notes (Signed)
Orthopedic Tech Progress Note Patient Details:  Tracey Lee September 02, 1975 [id number]  Patient ID: Tracey Lee, female   DOB: 11/03/1975, 40 y.o.   MRN: [id number] Patient did not want to use crutches over here. She told me she was going to use it when she gets home. Ace was given to her also.  Ladell Pier [facility] 08/31/2015, 4:48 PM

## 2015-08-31 NOTE — ED Provider Notes (Signed)
CSN: [id number]     Arrival date & time 08/31/15  1531 History   First MD Initiated Contact with Patient 08/31/15 1544     Chief Complaint  Patient presents with  . Knee Pain    right     (Consider location/radiation/quality/duration/timing/severity/associated sxs/prior Treatment) HPI Comments: Tracey Lee is a 40 y.o. female with a PMHx of obesity, knee arthritis, fibromyalgia, anxiety, HTN, and carpal tunnel syndrome, who presents to the ED with complaints of right knee pain 1 week. 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 She is The pain as 10/10 throbbing constant right knee pain radiating down into the lower leg, worse with weightbearing, improved with Goody's powders, and unrelieved with ibuprofen. Associated symptoms include swelling. She also states that 2-3 nights ago she hit it at work and has a bruise to the lateral aspect of the knee, but the pain had been ongoing since before this injury. She denies any redness, warmth, or abrasions. She also denies any fevers, chills, chest pain, shortness breath, abdominal pain, nausea, vomiting, diarrhea, constipation, dysuria, hematuria, numbness, tingling, or focal weakness.  Patient is a 40 y.o. female presenting with knee pain. The history is provided by the patient and medical records. No language interpreter was used.  Knee Pain Location:  Knee Time since incident:  1 week Injury: no   Knee location:  R knee Pain details:    Quality:  Throbbing   Radiates to:  R leg   Severity:  Severe   Onset quality:  Gradual   Duration:  1 week   Timing:  Constant   Progression:  Unchanged Chronicity:  Recurrent Prior injury to area:  No Relieved by:  NSAIDs (Goody's powder) Worsened by:  Bearing weight Ineffective treatments:  NSAIDs (ibuprofen) Associated symptoms: swelling   Associated symptoms:  no decreased ROM, no fever, no muscle weakness, no numbness and no tingling   Risk factors: obesity     Past Medical History  Diagnosis Date  . Fibromyalgia   . Carpal tunnel syndrome   . Hypertension   . Anxiety    Past Surgical History  Procedure Laterality Date  . Cholecystectomy     No family history on file. Social History  Substance Use Topics  . Smoking status: Never Smoker   . Smokeless tobacco: None  . Alcohol Use: No   OB History    Gravida Para Term Preterm AB TAB SAB Ectopic Multiple Living   4 2 2  1  1   3      Review of Systems  Constitutional: Negative for fever and chills.  Respiratory: Negative for shortness of breath.   Cardiovascular: Negative for chest pain.  Gastrointestinal: Negative for nausea, vomiting, abdominal pain, diarrhea and constipation.  Genitourinary: Negative for dysuria and hematuria.  Musculoskeletal: Positive for joint swelling and arthralgias. Negative for myalgias.  Skin: Positive for color change. Negative for wound.  Allergic/Immunologic: Negative for immunocompromised state.  Neurological: Negative for weakness and numbness.  Psychiatric/Behavioral: Negative for confusion.   10 Systems reviewed and are negative for acute change except as noted in the HPI.    Allergies  Review of patient's allergies indicates no known allergies.  Home Medications   Prior to Admission medications   Medication Sig Start Date End Date Taking? Authorizing Provider  diphenhydrAMINE (BENADRYL) 25 MG tablet Take 25  mg by mouth daily as needed for allergies.    Historical Provider, MD  ibuprofen (ADVIL,MOTRIN) 200 MG tablet Take 600 mg by mouth every 6 (six) hours as needed for mild pain.    Historical Provider, MD  Multiple Vitamin (MULTIVITAMIN WITH MINERALS) TABS tablet Take 1 tablet by mouth daily.    Historical Provider, MD  oxyCODONE-acetaminophen (PERCOCET/ROXICET) 5-325 MG per tablet 1 to 2 tabs PO q6hrs  PRN for pain Patient taking  differently: Take 1-2 tablets by mouth every 6 (six) hours as needed for moderate pain.  11/02/14   Nicole Pisciotta, PA-C   BP 137/92 mmHg  Pulse 73  Temp(Src) 98.3 F (36.8 C) (Oral)  Resp 16  SpO2 97% Physical Exam  Constitutional: She is oriented to person, place, and time. Vital signs are normal. She appears well-developed and well-nourished.  Non-toxic appearance. No distress.  Afebrile, nontoxic, NAD  HENT:  Head: Normocephalic and atraumatic.  Mouth/Throat: Mucous membranes are normal.  Eyes: Conjunctivae and EOM are normal. Right eye exhibits no discharge. Left eye exhibits no discharge.  Neck: Normal range of motion. Neck supple.  Cardiovascular: Normal rate and intact distal pulses.   Pulmonary/Chest: Effort normal. No respiratory distress.  Abdominal: Normal appearance. She exhibits no distension.  Musculoskeletal: Normal range of motion.       Right knee: She exhibits effusion (?possible, very slight, difficult to palpate due to body habitus) and ecchymosis. She exhibits normal range of motion, no swelling, no deformity, no laceration, no erythema, normal alignment, no LCL laxity, normal patellar mobility and no MCL laxity. Tenderness found. Medial joint line and lateral joint line tenderness noted.       Legs: R knee with FROM intact, with diffuse joint line TTP, no swelling or deformity, ?effusion although difficult to palpate due to body habitus, +bruising to lateral joint line, no erythema, no warmth, no abrasions, no abnormal alignment or patellar mobility, no varus/valgus laxity, neg anterior drawer test, no crepitus.  Strength and sensation grossly intact, distal pulses intact, compartments soft. No pedal edema, neg homan's  Neurological: She is alert and oriented to person, place, and time. She has normal strength. No sensory deficit.  Skin: Skin is warm, dry and intact. Bruising noted. No rash noted.  R knee bruise as noted above  Psychiatric: She has a normal mood and  affect. Her behavior is normal.  Nursing note and vitals reviewed.   ED Course  Procedures (including critical care time) Labs Review Labs Reviewed - No data to display  Imaging Review Dg Knee Complete 4 Views Right  08/31/2015  CLINICAL DATA:  Right anterior knee pain today. No known injury. Pain radiates to the right ankle. EXAM: RIGHT KNEE - COMPLETE 4+ VIEW COMPARISON:  None. FINDINGS: No fracture or dislocation. Small tricompartmental osteophytes with preservation of joint spaces. The alignment is maintained. No definite joint effusion. IMPRESSION: Early mild tricompartmental osteoarthritis. No acute bony abnormality. Electronically Signed   By: Jeb Levering M.D.   On: 08/31/2015 16:18   I have personally reviewed and evaluated these images and lab results as part of my medical decision-making.   EKG Interpretation None      MDM   Final diagnoses:  Right knee pain  Knee strain, right, initial encounter  Knee contusion, right, initial encounter  Obesity  Primary osteoarthritis of right knee    40 y.o. 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 NVI with soft compartments, diffuse tenderness along joint line. Likely arthritis or early arthritis due to carrying extra weight from being obese, and being on her feet a lot, but given that she hit the knee recently and has a bruise, will xray to eval for fx or other issues.  Will give pain meds and reassess shortly.   4:22 PM  Xray with early tricompartmental OA of knee, no acute injury. Will apply knee sleeve and give crutches for comfort. Weight loss discussed as this will likely help her knee pain. Pain meds given. RICE discussed. F/up with ortho in 1-2wks as needed for ongoing pain. Of note, NCDB reviewed and no narcotic rx's found in last 6 months. I explained the diagnosis and have given explicit precautions to return to the ER including for  any other new or worsening symptoms. The patient understands and accepts the medical plan as it's been dictated and I have answered their questions. Discharge instructions concerning home care and prescriptions have been given. The patient is STABLE and is discharged to home in good condition.   BP 137/92 mmHg  Pulse 73  Temp(Src) 98.3 F (36.8 C) (Oral)  Resp 16  SpO2 97%  Meds ordered this encounter  Medications  . HYDROcodone-acetaminophen (NORCO/VICODIN) 5-325 MG per tablet 1 tablet    Sig:   . HYDROcodone-acetaminophen (NORCO) 5-325 MG tablet    Sig: Take 1 tablet by mouth every 6 (six) hours as needed for severe pain.    Dispense:  6 tablet    Refill:  0    Order Specific Question:  Supervising Provider    Answer:  MILLER, BRIAN [[id number]]  . naproxen (NAPROSYN) 500 MG tablet    Sig: Take 1 tablet (500 mg total) by mouth 2 (two) times daily as needed for mild pain, moderate pain or headache (TAKE WITH MEALS.).    Dispense:  20 tablet    Refill:  0    Order Specific Question:  Supervising Provider    Answer:  Noemi Chapel [[id number]]     Dakwan Pridgen Camprubi-Soms, PA-C 08/31/15 1622  Virgel Manifold, MD 08/31/15 [phone]

## 2016-09-30 IMAGING — DX DG KNEE COMPLETE 4+V*L*
4 series · 4 of 4 positions shown · non-contrast
Comparison: None.

CLINICAL DATA: Left knee injury approximately 1 week ago when the
patient jumped down from a truck bed. Pain and swelling which began
approximately 2 days ago. Initial encounter.

EXAM:
LEFT KNEE - COMPLETE 4+ VIEW

[knee ap]
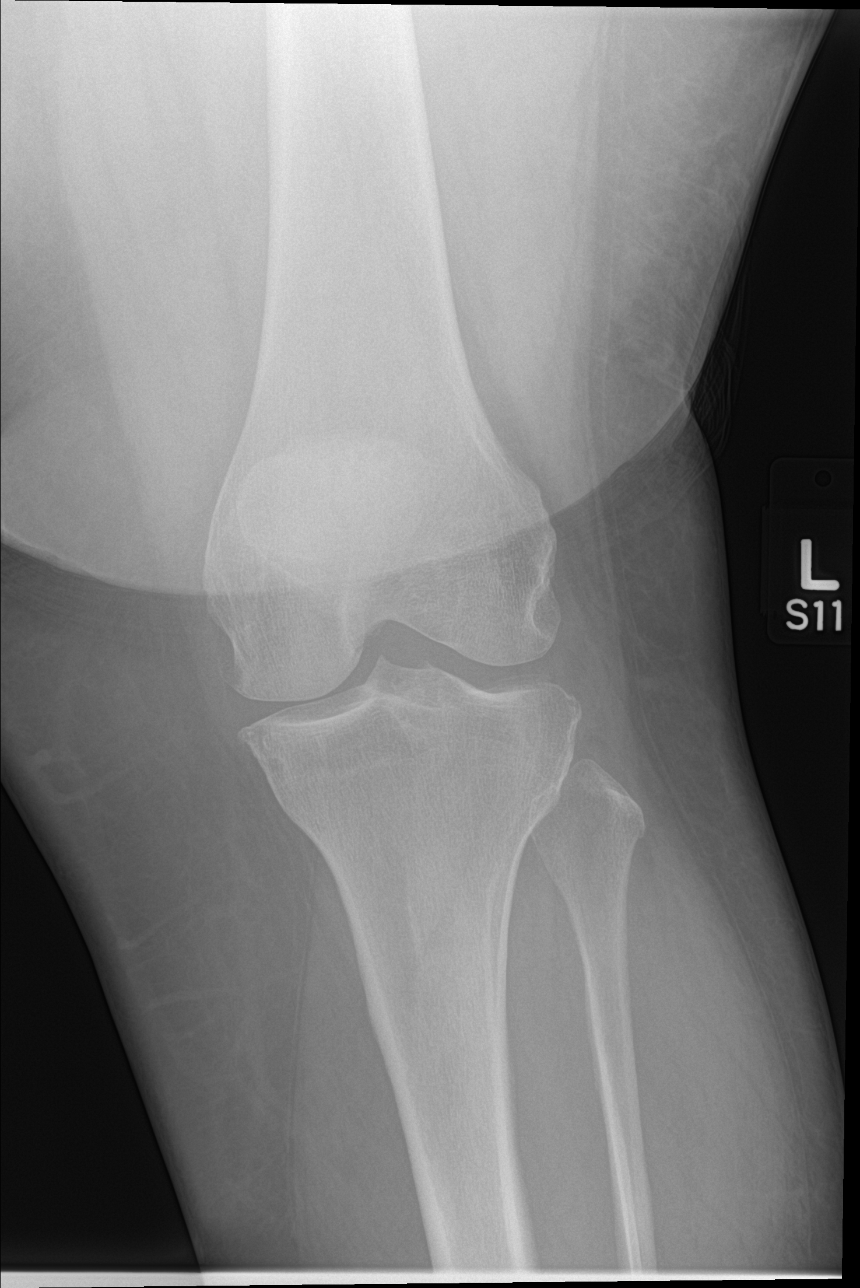

[knee obl (1 of 2)]
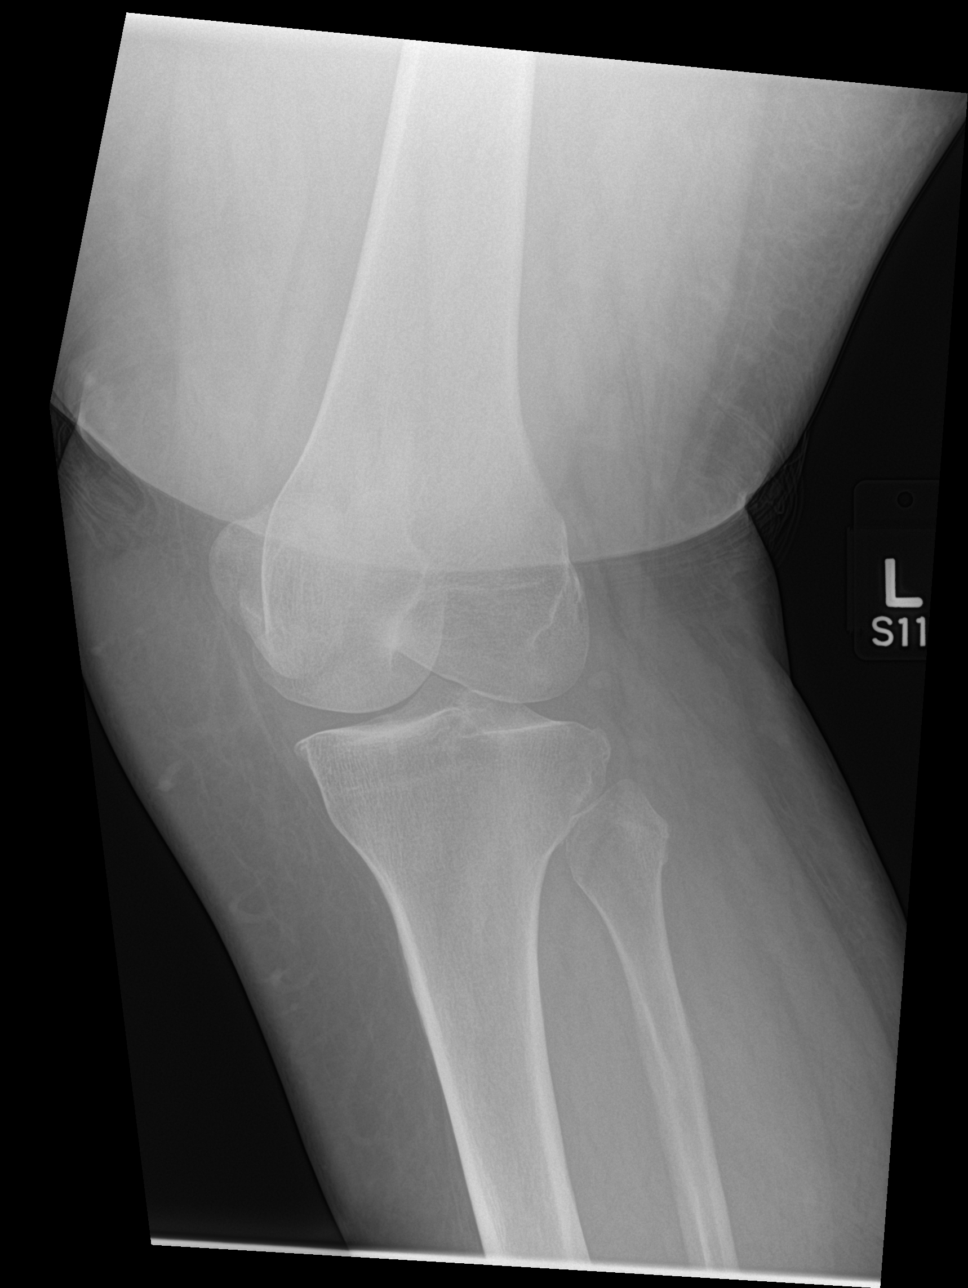

[knee obl (2 of 2)]
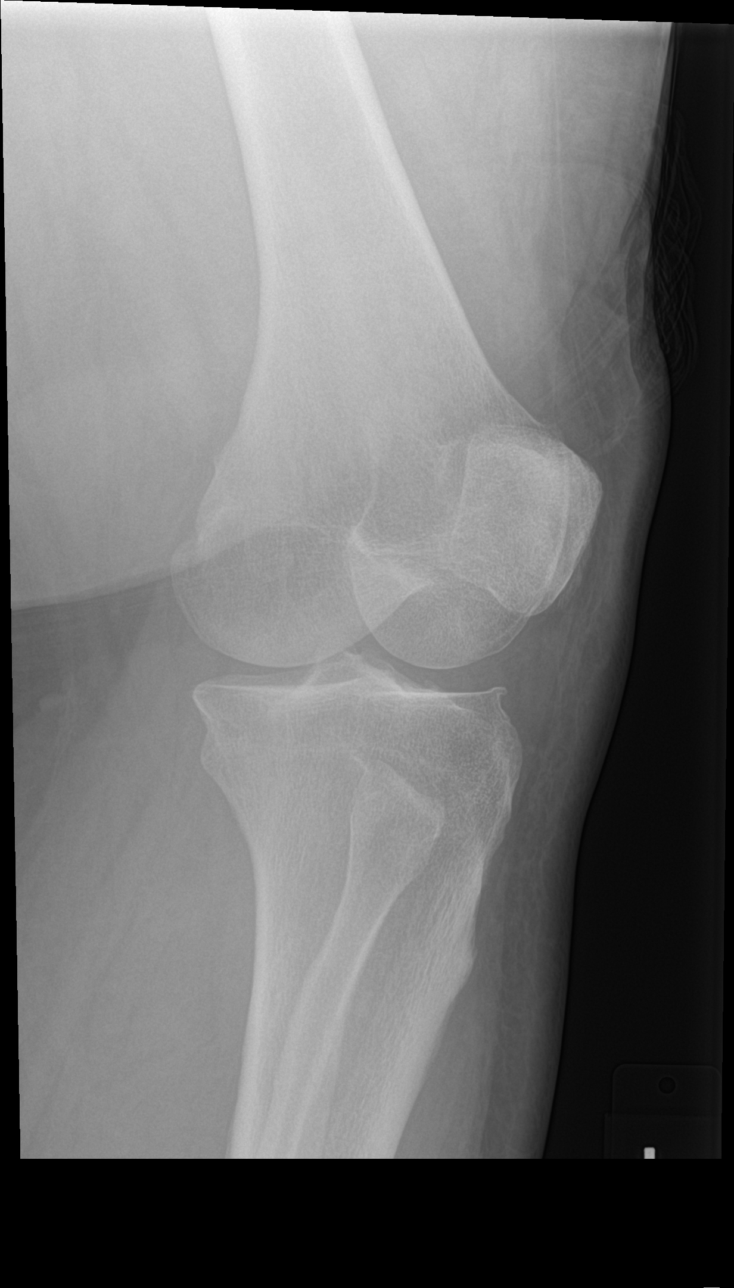

[knee lat]
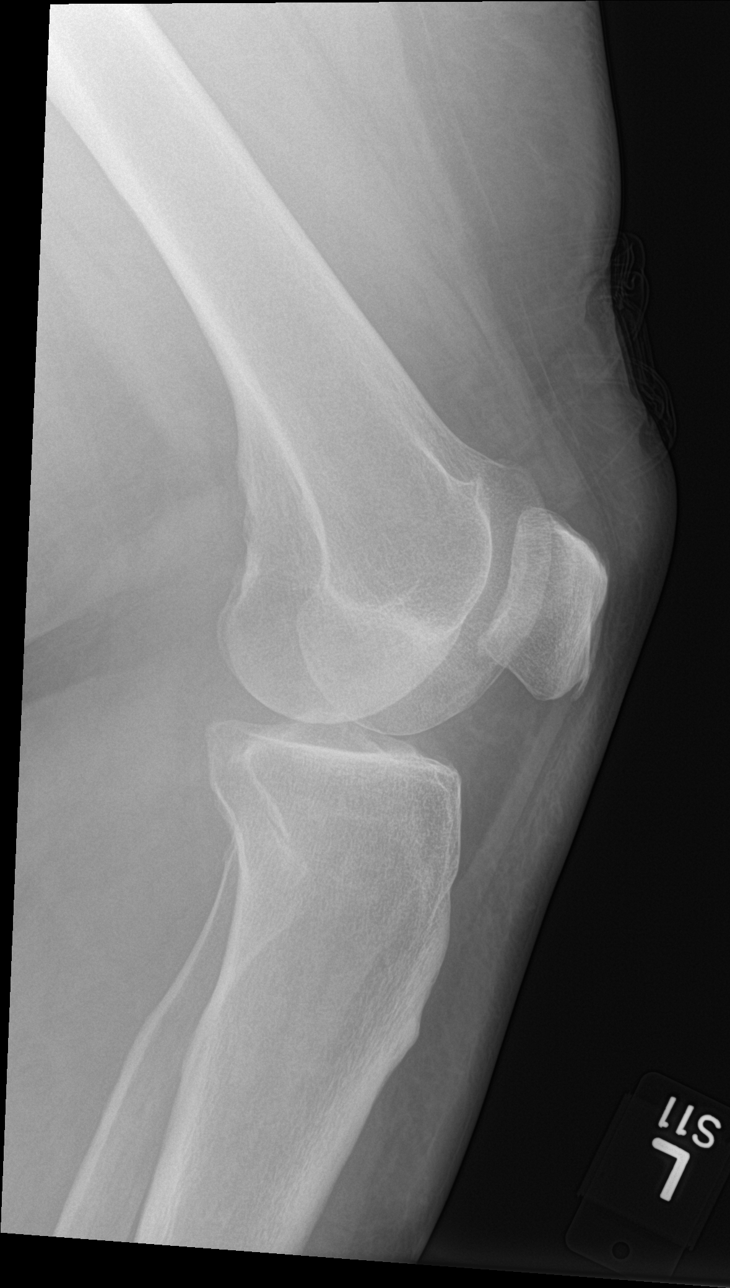

[4 of 4 positions shown; findings below may reference images not displayed]

FINDINGS: No evidence of acute or subacute fracture or dislocation. Mild
medial compartment joint space narrowing and associated hypertrophic
spurring. Patellofemoral and lateral compartment joint spaces well
preserved. Minimal enthesopathic spurring at the insertion of the
patellar tendon on the inferior patella. No visible joint effusion.
IMPRESSION: No acute or subacute osseous abnormality. Mild medial compartment
osteoarthritis.

## 2017-02-08 ENCOUNTER — Emergency Department (HOSPITAL_COMMUNITY)
Admission: EM | Admit: 2017-02-08 | Discharge: 2017-02-08 | Disposition: A | Discharge status: Home / Self Care | Payer: Self-pay | Attending: Emergency Medicine | Admitting: Emergency Medicine

## 2017-02-08 ENCOUNTER — Emergency Department (HOSPITAL_COMMUNITY): Payer: Self-pay

## 2017-02-08 ENCOUNTER — Encounter (HOSPITAL_COMMUNITY): Payer: Self-pay | Admitting: Emergency Medicine

## 2017-02-08 DIAGNOSIS — R110 Nausea: Secondary | ICD-10-CM | POA: Insufficient documentation

## 2017-02-08 DIAGNOSIS — F17210 Nicotine dependence, cigarettes, uncomplicated: Secondary | ICD-10-CM | POA: Insufficient documentation

## 2017-02-08 DIAGNOSIS — Z79899 Other long term (current) drug therapy: Secondary | ICD-10-CM | POA: Insufficient documentation

## 2017-02-08 DIAGNOSIS — R350 Frequency of micturition: Secondary | ICD-10-CM | POA: Insufficient documentation

## 2017-02-08 DIAGNOSIS — M545 Low back pain, unspecified: Secondary | ICD-10-CM

## 2017-02-08 DIAGNOSIS — I10 Essential (primary) hypertension: Secondary | ICD-10-CM | POA: Insufficient documentation

## 2017-02-08 DIAGNOSIS — N898 Other specified noninflammatory disorders of vagina: Secondary | ICD-10-CM | POA: Insufficient documentation

## 2017-02-08 LAB — URINALYSIS, ROUTINE W REFLEX MICROSCOPIC
BACTERIA UA: NONE SEEN
BILIRUBIN URINE: NEGATIVE
Glucose, UA: NEGATIVE mg/dL
Ketones, ur: NEGATIVE mg/dL
Leukocytes, UA: NEGATIVE
Nitrite: NEGATIVE
Protein, ur: NEGATIVE mg/dL
Specific Gravity, Urine: 1.025 (ref 1.005–1.030)
pH: 5 (ref 5.0–8.0)

## 2017-02-08 LAB — COMPREHENSIVE METABOLIC PANEL
ALT: 20 U/L (ref 14–54)
AST: 19 U/L (ref 15–41)
Albumin: 3.9 g/dL (ref 3.5–5.0)
Alkaline Phosphatase: 90 U/L (ref 38–126)
Anion gap: 9 (ref 5–15)
BILIRUBIN TOTAL: 0.3 mg/dL (ref 0.3–1.2)
BUN: 17 mg/dL (ref 6–20)
CO2: 24 mmol/L (ref 22–32)
Calcium: 9 mg/dL (ref 8.9–10.3)
Chloride: 105 mmol/L (ref 101–111)
Creatinine, Ser: 0.88 mg/dL (ref 0.44–1.00)
Glucose, Bld: 107 mg/dL — ABNORMAL HIGH (ref 65–99)
Potassium: 3.8 mmol/L (ref 3.5–5.1)
Sodium: 138 mmol/L (ref 135–145)
TOTAL PROTEIN: 7.5 g/dL (ref 6.5–8.1)

## 2017-02-08 LAB — CBC
HEMATOCRIT: 40.7 % (ref 36.0–46.0)
Hemoglobin: 13.5 g/dL (ref 12.0–15.0)
MCH: 29.9 pg (ref 26.0–34.0)
MCHC: 33.2 g/dL (ref 30.0–36.0)
MCV: 90.2 fL (ref 78.0–100.0)
Platelets: 338 10*3/uL (ref 150–400)
RBC: 4.51 MIL/uL (ref 3.87–5.11)
RDW: 13.3 % (ref 11.5–15.5)
WBC: 8.9 10*3/uL (ref 4.0–10.5)

## 2017-02-08 LAB — LIPASE, BLOOD: Lipase: 32 U/L (ref 11–51)

## 2017-02-08 LAB — WET PREP, GENITAL
Clue Cells Wet Prep HPF POC: NONE SEEN
Sperm: NONE SEEN
Trich, Wet Prep: NONE SEEN
WBC, Wet Prep HPF POC: NONE SEEN
Yeast Wet Prep HPF POC: NONE SEEN

## 2017-02-08 LAB — I-STAT BETA HCG BLOOD, ED (MC, WL, AP ONLY)

## 2017-02-08 MED ORDER — ONDANSETRON HCL 4 MG/2ML IJ SOLN
4.0000 mg | Freq: Once | INTRAMUSCULAR | Status: AC
  Administered 2017-02-08: 4 mg via INTRAVENOUS
  Filled 2017-02-08: qty 2

## 2017-02-08 MED ORDER — SODIUM CHLORIDE 0.9 % IV BOLUS (SEPSIS)
1000.0000 mL | Freq: Once | INTRAVENOUS | Status: AC
  Administered 2017-02-08: 1000 mL via INTRAVENOUS

## 2017-02-08 MED ORDER — MORPHINE SULFATE (PF) 2 MG/ML IV SOLN
4.0000 mg | Freq: Once | INTRAVENOUS | Status: AC
  Administered 2017-02-08: 4 mg via INTRAVENOUS
  Filled 2017-02-08: qty 2

## 2017-02-08 MED ORDER — MELOXICAM 7.5 MG PO TABS: 7.5000 mg | ORAL_TABLET | Freq: Every day | ORAL | 0 refills | Status: DC | PRN

## 2017-02-08 MED ORDER — CYCLOBENZAPRINE HCL 10 MG PO TABS: 10.0000 mg | ORAL_TABLET | Freq: Every evening | ORAL | 0 refills | Status: DC | PRN

## 2017-02-08 NOTE — ED Triage Notes (Signed)
Patient c/o right side pain and right flank pain with n/v x couple days.  Patient states that she hasnt had period in 3 months but had ablation with cervical cancer issues. Patient reports that she took 2 home pregnancy tests that were positive and one that was negative. So patient wanting verification if pregnant or not.

## 2017-02-08 NOTE — ED Provider Notes (Signed)
[facility] DEPT Provider Note   CSN: [id number] Arrival date & time: 02/08/17  1525     History   Chief Complaint Chief Complaint  Patient presents with  . Abdominal Pain  . Flank Pain  . Nausea  . Emesis    HPI Tracey Lee is a 41 y.o. female with past medical history of hypertension, anxiety, fibromyalgia, presenting for her sibling intermittent right flank pain that began 2 days ago. She states pain began worsening yesterday evening. She states pain is not relieved by Advil or Tylenol. She reports associated nausea and vomiting, increased urinary frequency and a heavy sensation when urinating. Denies abdominal pain, fever, chills, changes in bowel movements, hematemesis. She states she recently took 3 pregnancy tests at home, with 203 resulting as positive. She denies vaginal bleeding or discharge. No history of nephrolithiasis. Hx of cholecystectomy.  The history is provided by the patient.    Past Medical History:  Diagnosis Date  . Anxiety   . Carpal tunnel syndrome   . Fibromyalgia   . Hypertension     Patient Active Problem List   Diagnosis Date Noted  . CONTACT DERMATITIS DUE TO POISON IVY 03/13/2008  . UTI 03/07/2008    Past Surgical History:  Procedure Laterality Date  . CHOLECYSTECTOMY      OB History    Gravida Para Term Preterm AB Living   4 2 2   1 3    SAB TAB Ectopic Multiple Live Births   1               Home Medications    Prior to Admission medications   Medication Sig Start Date End Date Taking? Authorizing Provider  ibuprofen (ADVIL,MOTRIN) 200 MG tablet Take 600 mg by mouth every 6 (six) hours as needed for mild pain.   Yes [provider]  Multiple Vitamin (MULTIVITAMIN WITH MINERALS) TABS tablet Take 1 tablet by mouth daily.   Yes [provider]  naproxen (NAPROSYN) 500 MG tablet Take 1 tablet (500 mg total) by mouth 2 (two) times daily as needed for mild pain, moderate pain or headache (TAKE WITH MEALS.).  08/31/15  Yes Street, Albert, PA-C  cyclobenzaprine (FLEXERIL) 10 MG tablet Take 1 tablet (10 mg total) by mouth at bedtime as needed for muscle spasms. 02/08/17   Russo, [place] N, PA-C  diphenhydrAMINE (BENADRYL) 25 MG tablet Take 25 mg by mouth daily as needed for allergies.    [provider]  HYDROcodone-acetaminophen (NORCO) 5-325 MG tablet Take 1 tablet by mouth every 6 (six) hours as needed for severe pain. Patient not taking: Reported on 02/08/2017 08/31/15   Street, Limon, PA-C  meloxicam (MOBIC) 7.5 MG tablet Take 1 tablet (7.5 mg total) by mouth daily as needed for pain. 02/08/17   Russo, Martin[place] N, PA-C  oxyCODONE-acetaminophen (PERCOCET/ROXICET) 5-325 MG per tablet 1 to 2 tabs PO q6hrs  PRN for pain Patient not taking: Reported on 02/08/2017 11/02/14   Pisciotta, Elmyra Ricks, PA-C    Family History No family history on file.  Social History Social History  Substance Use Topics  . Smoking status: Current Every Day Smoker    Types: Cigarettes  . Smokeless tobacco: Never Used  . Alcohol use No     Allergies   Patient has no known allergies.   Review of Systems Review of Systems  Constitutional: Negative for chills and fever.  HENT: Negative.   Eyes: Negative.   Respiratory: Negative for shortness of breath.   Cardiovascular: Negative for  chest pain.  Gastrointestinal: Positive for nausea and vomiting. Negative for abdominal pain, blood in stool, constipation and diarrhea.  Genitourinary: Positive for flank pain (R) and frequency. Negative for dysuria, hematuria, vaginal bleeding and vaginal discharge.  Skin: Negative.   Neurological: Negative.      Physical Exam Updated Vital Signs BP 136/68   Pulse 61   Temp 97.9 F (36.6 C) (Oral)   Resp 18   SpO2 100%   Physical Exam  Constitutional: She appears well-developed and well-nourished.  Morbidly obese.   HENT:  Head: Normocephalic and atraumatic.  Mouth/Throat: Oropharynx is clear and moist.  Eyes:  Conjunctivae are normal.  Cardiovascular: Normal rate, regular rhythm, normal heart sounds and intact distal pulses.  Exam reveals no friction rub.   No murmur heard. Pulmonary/Chest: Effort normal and breath sounds normal. No respiratory distress. She has no wheezes. She has no rales.  Abdominal: Soft. Normal appearance and bowel sounds are normal. She exhibits no distension. There is tenderness (right-sided). There is CVA tenderness (right). There is no rebound and no guarding.  Genitourinary: There is no rash or tenderness on the right labia. There is no rash or tenderness on the left labia. Cervix exhibits no motion tenderness, no discharge and no friability. Right adnexum displays no tenderness. Left adnexum displays no tenderness. No erythema or tenderness in the vagina. Vaginal discharge (white malodorous discharge) found.  Genitourinary Comments: Exam performed with RN chaperone present.  Musculoskeletal:  Right lower back into right gluteus with TTP. No spinal or paraspinal tenderness. No bony step-offs. No gross deformities.  Neurological: She is alert.  5/5 strength bilateral upper and lower extremities. Normal coordination. Normal gait.  Skin: Skin is warm.  Psychiatric: She has a normal mood and affect. Her behavior is normal.  Nursing note and vitals reviewed.    ED Treatments / Results  Labs (all labs ordered are listed, but only abnormal results are displayed) Labs Reviewed  COMPREHENSIVE METABOLIC PANEL - Abnormal; Notable for the following:       Result Value   Glucose, Bld 107 (*)    All other components within normal limits  URINALYSIS, ROUTINE W REFLEX MICROSCOPIC - Abnormal; Notable for the following:    Hgb urine dipstick SMALL (*)    Squamous Epithelial / LPF 0-5 (*)    All other components within normal limits  WET PREP, GENITAL  LIPASE, BLOOD  CBC  I-STAT BETA HCG BLOOD, ED ([facility], [facility], [facility] ONLY)  GC/CHLAMYDIA PROBE AMP ([place]) NOT AT [facility]    EKG  EKG  Interpretation None       Radiology Ct Renal Stone Study  Result Date: 02/08/2017 CLINICAL DATA:  Right flank pain, nausea/ vomiting EXAM: CT ABDOMEN AND PELVIS WITHOUT CONTRAST TECHNIQUE: Multidetector CT imaging of the abdomen and pelvis was performed following the standard protocol without IV contrast. COMPARISON:  None. FINDINGS: Lower chest: Lung bases are clear. Hepatobiliary: Unenhanced liver is unremarkable. Status post cholecystectomy. No intrahepatic or extrahepatic ductal dilatation. Pancreas: Within normal limits. Spleen: Within normal limits. Adrenals/Urinary Tract: Adrenal glands are within normal limits. Punctate nonobstructing left upper pole renal calculus (coronal image 104). Right kidney is within normal limits. No hydronephrosis. Bladder is within normal limits. Stomach/Bowel: Stomach is within normal limits. No evidence of bowel obstruction. Normal appendix (series 2/ image 56). Vascular/Lymphatic: No evidence of abdominal aortic aneurysm. Small retroperitoneal lymph nodes which do not meet pathologic CT size criteria. Reproductive: 6.2 cm subserosal right fundal fibroid (series 2/ image 71). No adnexal  masses. Other: No abdominopelvic ascites. Musculoskeletal: Mild degenerative changes of the lower thoracic spine. IMPRESSION: Punctate nonobstructing left upper pole renal calculus. No ureteral or bladder calculi. No hydronephrosis. No evidence of bowel obstruction.  Normal appendix. 6.2 cm subserosal right fundal fibroid. No CT findings to account for the patient's right flank pain. Electronically Signed   By: Julian Hy M.D.   On: 02/08/2017 18:46    Procedures Procedures (including critical care time)  Medications Ordered in ED Medications  sodium chloride 0.9 % bolus 1,000 mL (0 mLs Intravenous Stopped 02/08/17 1941)  ondansetron (ZOFRAN) injection 4 mg (4 mg Intravenous Given 02/08/17 1834)  morphine 2 MG/ML injection 4 mg (4 mg Intravenous Given 02/08/17 1835)      Initial Impression / Assessment and Plan / ED Course  I have reviewed the triage vital signs and the nursing notes.  Pertinent labs & imaging results that were available during my care of the patient were reviewed by me and considered in my medical decision making (see chart for details).     Pt presenting with right-sided flank/back pain and urinary symptoms. UA with small hemoglobin, otherwise unremarkable. CBC and CMP normal. CT renal stone study negative for nephrolithiasis or acute abdominal pathology. Discussed incidental findings of uterine fibroid and recommended OB/GYN follow-up. Symptoms improved with interventions. Pelvic exam without CMT or adnexal tenderness. Wet prep negative. Doubt pyelonephritis, patient without fever or leukocytosis. On reevaluation, patient notes she does heavy  lifting at work and thinks this may be a muscle strain. Patient without spinal or paraspinal tenderness, no neurologic deficits. Will discharge with symptomatic management and return precautions. Patient is nontoxic, hemodynamically stable, nondistressed, safe for discharge home.  Patient discussed with Dr. Ralene Bathe.  Discussed results, findings, treatment and follow up. Patient advised of return precautions. Patient verbalized understanding and agreed with plan.   Final Clinical Impressions(s) / ED Diagnoses   Final diagnoses:  Acute right-sided low back pain without sciatica  Nausea    New Prescriptions Discharge Medication List as of 02/08/2017  9:43 PM    START taking these medications   Details  cyclobenzaprine (FLEXERIL) 10 MG tablet Take 1 tablet (10 mg total) by mouth at bedtime as needed for muscle spasms., Starting Mon 02/08/2017, Print    meloxicam (MOBIC) 7.5 MG tablet Take 1 tablet (7.5 mg total) by mouth daily as needed for pain., Starting Mon 02/08/2017, Print         Russo, [place] N, PA-C 02/08/17 2317    Russo, [place] N, PA-C 02/08/17 2318    Quintella Reichert,  MD 02/09/17 Benancio Deeds

## 2017-02-08 NOTE — ED Notes (Signed)
Patient transported to CT 

## 2017-02-08 NOTE — Discharge Instructions (Signed)
Please read instructions below. Talk with your PCP about any new medications. Follow up with your PCP about today's visit. You can take mobic once per day with meals as needed for pain.  You can take flexeril at bedtime as needed for muscle spasm. Drink plenty of water. Return to ER for fever, new numbness or tingling in your arms or legs, inability to urinate, inability to hold your bowels, or weakness in your extremities.

## 2017-02-08 NOTE — ED Notes (Signed)
Discharge instructions reviewed with patient. Patient verbalizes understanding. VSS.   

## 2017-02-09 LAB — GC/CHLAMYDIA PROBE AMP (~~LOC~~) NOT AT ARMC
CHLAMYDIA, DNA PROBE: NEGATIVE
Neisseria Gonorrhea: NEGATIVE

## 2017-10-17 ENCOUNTER — Encounter (HOSPITAL_COMMUNITY): Payer: Self-pay | Admitting: *Deleted

## 2017-10-17 ENCOUNTER — Other Ambulatory Visit: Payer: Self-pay

## 2017-10-17 ENCOUNTER — Emergency Department (HOSPITAL_COMMUNITY)
Admission: EM | Admit: 2017-10-17 | Discharge: 2017-10-18 | Disposition: A | Discharge status: Home / Self Care | Payer: Self-pay | Attending: Emergency Medicine | Admitting: Emergency Medicine

## 2017-10-17 DIAGNOSIS — F17210 Nicotine dependence, cigarettes, uncomplicated: Secondary | ICD-10-CM | POA: Insufficient documentation

## 2017-10-17 DIAGNOSIS — Z79899 Other long term (current) drug therapy: Secondary | ICD-10-CM | POA: Insufficient documentation

## 2017-10-17 DIAGNOSIS — I10 Essential (primary) hypertension: Secondary | ICD-10-CM | POA: Insufficient documentation

## 2017-10-17 DIAGNOSIS — K0889 Other specified disorders of teeth and supporting structures: Secondary | ICD-10-CM | POA: Insufficient documentation

## 2017-10-17 HISTORY — DX: Other cervical disc degeneration, unspecified cervical region: M50.30

## 2017-10-17 NOTE — ED Triage Notes (Addendum)
Pt c/o dental pain for the past week; reports her top and bottom molar on the R side broke off on Friday. Has had a knot to R jaw come up over the weekend with a headache, sore throat, and bad taste in her mouth; has used a friend's magic mouthwash with some improvement of pain.

## 2017-10-18 MED ORDER — TRAMADOL HCL 50 MG PO TABS: 50.0000 mg | ORAL_TABLET | Freq: Four times a day (QID) | ORAL | 0 refills | Status: DC | PRN

## 2017-10-18 MED ORDER — LIDOCAINE VISCOUS 2 % MT SOLN: 20.0000 mL | OROMUCOSAL | 0 refills | Status: DC | PRN

## 2017-10-18 MED ORDER — AMOXICILLIN 500 MG PO CAPS: 500.0000 mg | ORAL_CAPSULE | Freq: Three times a day (TID) | ORAL | 0 refills | Status: DC

## 2017-10-18 NOTE — Discharge Instructions (Addendum)
You have a dental injury/infection. It is very important that you get evaluated by a dentist as soon as possible. Call tomorrow to schedule an appointment. Ibuprofen and tylenol as needed for pain. Take your full course of antibiotics. Read the instructions below.  Have given you tramdol for pain. This can you drowsy so do not drive with it.  Eat a soft or liquid diet and rinse your mouth out after meals with warm water. You should see a dentist or return here at once if you have increased swelling, increased pain or uncontrolled bleeding from the site of your injury.  SEEK MEDICAL CARE IF:  You have increased pain not controlled with medicines.  You have swelling around your tooth, in your face or neck.  You have bleeding which starts, continues, or gets worse.  You have a fever >101 If you are unable to open your mouth

## 2017-10-18 NOTE — ED Provider Notes (Signed)
[facility]ERG[facility] DEPARTMENT Provider Note   CSN: [id number] Arrival date & time: 10/17/17  2335     History   Chief Complaint Chief Complaint  Patient presents with  . Dental Pain    HPI Tracey Lee is a 42 y.o. female.  HPI 42 year old female with no pertinent past medical history presents to the emergency department today for evaluation of dental pain.  Patient states for the past week she had increased pain to her right lower molars.  She states that she broke off her tooth several days ago.  States that the past few days she has had worsening swelling to the area.  She also reports the pain radiates to her right ear and gives her a headache.  She has been taken Tylenol with some improvement in the pain.  She also reports using a friend's Magic mouthwash with improvement of the pain.  Patient has a follow-up appointment with dentist this week.  Denies any associated fevers or facial swelling.  Denies any difficulties breathing or swallowing. Past Medical History:  Diagnosis Date  . Anxiety   . Carpal tunnel syndrome   . Degenerative cervical disc   . Fibromyalgia   . Hypertension     Patient Active Problem List   Diagnosis Date Noted  . CONTACT DERMATITIS DUE TO POISON IVY 03/13/2008  . UTI 03/07/2008    Past Surgical History:  Procedure Laterality Date  . CERVICAL ABLATION    . CHOLECYSTECTOMY       OB History    Gravida  4   Para  2   Term  2   Preterm      AB  1   Living  3     SAB  1   TAB      Ectopic      Multiple      Live Births               Home Medications    Prior to Admission medications   Medication Sig Start Date End Date Taking? Authorizing Provider  amoxicillin (AMOXIL) 500 MG capsule Take 1 capsule (500 mg total) by mouth 3 (three) times daily. 10/18/17   Doristine Devoid, PA-C  cyclobenzaprine (FLEXERIL) 10 MG tablet Take 1 tablet (10 mg total) by mouth at bedtime as needed for muscle  spasms. 02/08/17   Robinson, [place] N, PA-C  diphenhydrAMINE (BENADRYL) 25 MG tablet Take 25 mg by mouth daily as needed for allergies.    [provider]  HYDROcodone-acetaminophen (NORCO) 5-325 MG tablet Take 1 tablet by mouth every 6 (six) hours as needed for severe pain. Patient not taking: Reported on 02/08/2017 08/31/15   Street, Leander, PA-C  ibuprofen (ADVIL,MOTRIN) 200 MG tablet Take 600 mg by mouth every 6 (six) hours as needed for mild pain.    [provider]  lidocaine (XYLOCAINE) 2 % solution Use as directed 20 mLs in the mouth or throat as needed for mouth pain. 10/18/17   Doristine Devoid, PA-C  meloxicam (MOBIC) 7.5 MG tablet Take 1 tablet (7.5 mg total) by mouth daily as needed for pain. 02/08/17   Robinson, [place] N, PA-C  Multiple Vitamin (MULTIVITAMIN WITH MINERALS) TABS tablet Take 1 tablet by mouth daily.    [provider]  naproxen (NAPROSYN) 500 MG tablet Take 1 tablet (500 mg total) by mouth 2 (two) times daily as needed for mild pain, moderate pain or headache (TAKE WITH MEALS.). 08/31/15   Street,  Mercedes, PA-C  oxyCODONE-acetaminophen (PERCOCET/ROXICET) 5-325 MG per tablet 1 to 2 tabs PO q6hrs  PRN for pain Patient not taking: Reported on 02/08/2017 11/02/14   Pisciotta, Elmyra Ricks, PA-C  traMADol (ULTRAM) 50 MG tablet Take 1 tablet (50 mg total) by mouth every 6 (six) hours as needed. 10/18/17   Doristine Devoid, PA-C    Family History No family history on file.  Social History Social History   Tobacco Use  . Smoking status: Current Every Day Smoker    Packs/day: 0.50    Types: Cigarettes  . Smokeless tobacco: Never Used  Substance Use Topics  . Alcohol use: No  . Drug use: No     Allergies   Patient has no known allergies.   Review of Systems Review of Systems  All other systems reviewed and are negative.    Physical Exam Updated Vital Signs BP (!) 148/92 (BP Location: Right Arm)   Pulse 60   Temp 98.4 F (36.9 C)  (Oral)   Resp 16   SpO2 100%   Physical Exam  Constitutional: She appears well-developed and well-nourished. No distress.  HENT:  Head: Normocephalic and atraumatic.  Several dental caries noted.  Patient does have some pain to palpation in the right lower gumline.  There is no significant erythema or edema noted.  No gross abscess.  No sublingual or submandibular swelling.  Oropharynx is clear.  Managing secretions tolerating airway.  No facial swelling noted.  Eyes: Right eye exhibits no discharge. Left eye exhibits no discharge. No scleral icterus.  Neck: Normal range of motion. Neck supple.  Pulmonary/Chest: No respiratory distress.  Musculoskeletal: Normal range of motion.  Neurological: She is alert.  Skin: No pallor.  Psychiatric: Her behavior is normal. Judgment and thought content normal.  Nursing note and vitals reviewed.    ED Treatments / Results  Labs (all labs ordered are listed, but only abnormal results are displayed) Labs Reviewed - No data to display  EKG None  Radiology No results found.  Procedures Procedures (including critical care time)  Medications Ordered in ED Medications - No data to display   Initial Impression / Assessment and Plan / ED Course  I have reviewed the triage vital signs and the nursing notes.  Pertinent labs & imaging results that were available during my care of the patient were reviewed by me and considered in my medical decision making (see chart for details).     Patient with toothache.  No gross abscess.  Exam unconcerning for Ludwig's angina or spread of infection.  Will treat with penicillin and pain medicine.  Urged patient to follow-up with dentist.     Final Clinical Impressions(s) / ED Diagnoses   Final diagnoses:  Pain, dental    ED Discharge Orders        Ordered    amoxicillin (AMOXIL) 500 MG capsule  3 times daily     10/18/17 0407    traMADol (ULTRAM) 50 MG tablet  Every 6 hours PRN     10/18/17 0407      lidocaine (XYLOCAINE) 2 % solution  As needed     10/18/17 0407       Doristine Devoid, PA-C 10/18/17 4008    Ripley Fraise, MD 10/18/17 [phone]

## 2019-03-03 ENCOUNTER — Emergency Department (HOSPITAL_COMMUNITY)
Admission: EM | Admit: 2019-03-03 | Discharge: 2019-03-03 | Disposition: A | Discharge status: Home / Self Care | Payer: Self-pay | Attending: Emergency Medicine | Admitting: Emergency Medicine

## 2019-03-03 ENCOUNTER — Emergency Department (HOSPITAL_COMMUNITY): Payer: Self-pay

## 2019-03-03 ENCOUNTER — Other Ambulatory Visit: Payer: Self-pay

## 2019-03-03 ENCOUNTER — Encounter (HOSPITAL_COMMUNITY): Payer: Self-pay

## 2019-03-03 DIAGNOSIS — R0789 Other chest pain: Secondary | ICD-10-CM | POA: Insufficient documentation

## 2019-03-03 DIAGNOSIS — I10 Essential (primary) hypertension: Secondary | ICD-10-CM | POA: Insufficient documentation

## 2019-03-03 DIAGNOSIS — F17210 Nicotine dependence, cigarettes, uncomplicated: Secondary | ICD-10-CM | POA: Insufficient documentation

## 2019-03-03 HISTORY — DX: Malignant (primary) neoplasm, unspecified: C80.1

## 2019-03-03 LAB — BASIC METABOLIC PANEL
Anion gap: 10 (ref 5–15)
BUN: 16 mg/dL (ref 6–20)
CO2: 25 mmol/L (ref 22–32)
Calcium: 9.3 mg/dL (ref 8.9–10.3)
Chloride: 102 mmol/L (ref 98–111)
Creatinine, Ser: 0.89 mg/dL (ref 0.44–1.00)
GFR calc Af Amer: >60 mL/min (ref >60)
GFR calc non Af Amer: >60 mL/min (ref >60)
Glucose, Bld: 93 mg/dL (ref 70–99)
Potassium: 4.2 mmol/L (ref 3.5–5.1)
Sodium: 137 mmol/L (ref 135–145)

## 2019-03-03 LAB — TROPONIN I (HIGH SENSITIVITY)
Troponin I (High Sensitivity): 42 ng/L — ABNORMAL HIGH (ref <18)
Troponin I (High Sensitivity): 50 ng/L — ABNORMAL HIGH (ref <18)

## 2019-03-03 LAB — CBC
HCT: 44.3 % (ref 36.0–46.0)
Hemoglobin: 14.3 g/dL (ref 12.0–15.0)
MCH: 29.4 pg (ref 26.0–34.0)
MCHC: 32.3 g/dL (ref 30.0–36.0)
MCV: 91 fL (ref 80.0–100.0)
Platelets: 350 10*3/uL (ref 150–400)
RBC: 4.87 MIL/uL (ref 3.87–5.11)
RDW: 13.2 % (ref 11.5–15.5)
WBC: 12.2 10*3/uL — ABNORMAL HIGH (ref 4.0–10.5)
nRBC: 0 % (ref 0.0–0.2)

## 2019-03-03 MED ORDER — FAMOTIDINE 20 MG PO TABS: 20.0000 mg | ORAL_TABLET | Freq: Two times a day (BID) | ORAL | 0 refills | Status: AC

## 2019-03-03 MED ORDER — LORAZEPAM 2 MG/ML IJ SOLN
1.0000 mg | Freq: Once | INTRAMUSCULAR | Status: AC
  Administered 2019-03-03: 14:00:00 1 mg via INTRAVENOUS
  Filled 2019-03-03: qty 1

## 2019-03-03 MED ORDER — LIDOCAINE VISCOUS HCL 2 % MT SOLN
15.0000 mL | Freq: Once | OROMUCOSAL | Status: AC
  Administered 2019-03-03: 15 mL via ORAL
  Filled 2019-03-03: qty 15

## 2019-03-03 MED ORDER — ALUM & MAG HYDROXIDE-SIMETH 200-200-20 MG/5ML PO SUSP
30.0000 mL | Freq: Once | ORAL | Status: AC
  Administered 2019-03-03: 30 mL via ORAL
  Filled 2019-03-03: qty 30

## 2019-03-03 NOTE — ED Triage Notes (Signed)
Patient states she felt something crunch in her mouth when she was eating ice cream last night. Patient states she woke up with pain throughout her chest, but pointing to her upper chest.. Patient screaming and crying in triage. Patient states she is unable to breath.  Patient is able to swallow her own saliva and is talking in complete sentences. Patient is very anxious. Patient also reports that she has not had a period  In several months. Patient states she had a positive pregnancy test and then a negative test.

## 2019-03-03 NOTE — ED Provider Notes (Signed)
[facility] DEPT Provider Note   CSN: [id number] Arrival date & time: 03/03/19  1216     History   Chief Complaint Chief Complaint  Patient presents with  . Chest Pain    HPI CASEY NEGRO is a 43 y.o. female.     The history is provided by the patient and medical records. No language interpreter was used.  Chest Pain    43 year old female with history of fibromyalgia, anxiety, hypertension, tobacco use, presenting for evaluation of chest pain.  Patient report yesterday she went to an ice cream shop and ate some ice cream.  In the process she felt she was crunching on something while eating it.  She thought it may be some ice.  She did not think much of it but this morning she was awoke with crushing chest pain.  She described as a pressure sensation across her chest radiates to her back, persistent, feeling very uncomfortable, worse when she bending forward or when she lays back.  Pain is moderate in severity and has not improved.  She feels anxious.  She felt it was related to something that a she ate last night.  She does not complain of any fever chills no lightheadedness or dizziness no productive cough no nausea vomiting or diarrhea.  She mention himself isolated for the past 8 months due to COVID-19 and have not been exposed to anyone with sickness.  She does smoke approximately a pack of cigarettes a day.  Past Medical History:  Diagnosis Date  . Anxiety   . Cancer ([place])   . Carpal tunnel syndrome   . Degenerative cervical disc   . Fibromyalgia   . Hypertension     Patient Active Problem List   Diagnosis Date Noted  . CONTACT DERMATITIS DUE TO POISON IVY 03/13/2008  . UTI 03/07/2008    Past Surgical History:  Procedure Laterality Date  . CERVICAL ABLATION    . CHOLECYSTECTOMY    . NECK SURGERY    . scalp surgery       OB History    Gravida  4   Para  2   Term  2   Preterm      AB  1   Living  3     SAB  1   TAB      Ectopic      Multiple      Live Births               Home Medications    Prior to Admission medications   Medication Sig Start Date End Date Taking? Authorizing Provider  amoxicillin (AMOXIL) 500 MG capsule Take 1 capsule (500 mg total) by mouth 3 (three) times daily. 10/18/17   Doristine Devoid, PA-C  cyclobenzaprine (FLEXERIL) 10 MG tablet Take 1 tablet (10 mg total) by mouth at bedtime as needed for muscle spasms. 02/08/17   Robinson, [place] N, PA-C  diphenhydrAMINE (BENADRYL) 25 MG tablet Take 25 mg by mouth daily as needed for allergies.    [provider]  HYDROcodone-acetaminophen (NORCO) 5-325 MG tablet Take 1 tablet by mouth every 6 (six) hours as needed for severe pain. Patient not taking: Reported on 02/08/2017 08/31/15   Street, [place], PA-C  ibuprofen (ADVIL,MOTRIN) 200 MG tablet Take 600 mg by mouth every 6 (six) hours as needed for mild pain.    [provider]  lidocaine (XYLOCAINE) 2 % solution Use as directed 20 mLs in the mouth or throat  as needed for mouth pain. 10/18/17   Doristine Devoid, PA-C  meloxicam (MOBIC) 7.5 MG tablet Take 1 tablet (7.5 mg total) by mouth daily as needed for pain. 02/08/17   Robinson, [place] N, PA-C  Multiple Vitamin (MULTIVITAMIN WITH MINERALS) TABS tablet Take 1 tablet by mouth daily.    [provider]  naproxen (NAPROSYN) 500 MG tablet Take 1 tablet (500 mg total) by mouth 2 (two) times daily as needed for mild pain, moderate pain or headache (TAKE WITH MEALS.). 08/31/15   Street, [place], PA-C  oxyCODONE-acetaminophen (PERCOCET/ROXICET) 5-325 MG per tablet 1 to 2 tabs PO q6hrs  PRN for pain Patient not taking: Reported on 02/08/2017 11/02/14   Pisciotta, Elmyra Ricks, PA-C  traMADol (ULTRAM) 50 MG tablet Take 1 tablet (50 mg total) by mouth every 6 (six) hours as needed. 10/18/17   Doristine Devoid, PA-C    Family History Family History  Problem Relation Age of Onset  . Heart failure Mother   .  Anxiety disorder Mother   . Irregular heart beat Mother   . Diabetes Father   . Cancer Father     Social History Social History   Tobacco Use  . Smoking status: Current Every Day Smoker    Packs/day: 0.50    Types: Cigarettes  . Smokeless tobacco: Never Used  Substance Use Topics  . Alcohol use: No  . Drug use: No     Allergies   Patient has no known allergies.   Review of Systems Review of Systems  Cardiovascular: Positive for chest pain.  All other systems reviewed and are negative.    Physical Exam Updated Vital Signs BP (!) 147/107 (BP Location: Right Arm)   Pulse 88   Temp 98.5 F (36.9 C) (Oral)   Resp 18   Ht 5\' 5"  (1.651 m)   Wt 117.9 kg   SpO2 99%   BMI 43.27 kg/m   Physical Exam Vitals signs and nursing note reviewed.  Constitutional:      General: She is not in acute distress.    Appearance: She is well-developed. She is obese.     Comments: Obese female sitting upright in bed appears to be in distraught, actively crying but nontoxic in appearance  HENT:     Head: Atraumatic.  Eyes:     Conjunctiva/sclera: Conjunctivae normal.  Neck:     Musculoskeletal: Neck supple.  Cardiovascular:     Rate and Rhythm: Normal rate and regular rhythm.     Pulses: Normal pulses.     Heart sounds: Normal heart sounds.  Pulmonary:     Effort: Pulmonary effort is normal.     Breath sounds: Normal breath sounds. No wheezing, rhonchi or rales.  Abdominal:     Palpations: Abdomen is soft.     Tenderness: There is no abdominal tenderness.  Musculoskeletal:        General: No swelling.  Skin:    Capillary Refill: Capillary refill takes less than 2 seconds.     Findings: No rash.  Neurological:     Mental Status: She is alert and oriented to person, place, and time.  Psychiatric:        Mood and Affect: Mood normal.      ED Treatments / Results  Labs (all labs ordered are listed, but only abnormal results are displayed) Labs Reviewed  CBC -  Abnormal; Notable for the following components:      Result Value   WBC 12.2 (*)    All  other components within normal limits  TROPONIN I (HIGH SENSITIVITY) - Abnormal; Notable for the following components:   Troponin I (High Sensitivity) 42 (*)    All other components within normal limits  BASIC METABOLIC PANEL  TROPONIN I (HIGH SENSITIVITY)    EKG EKG Interpretation  Date/Time:  Friday March 03 2019 13:32:21 EDT Ventricular Rate:  81 PR Interval:    QRS Duration: 85 QT Interval:  374 QTC Calculation: 435 R Axis:   87 Text Interpretation:  Sinus rhythm Confirmed by Lacretia Leigh ([id number]) on 03/03/2019 2:57:48 PM    Date: 03/03/2019  Rate: 81  Rhythm: normal sinus rhythm  QRS Axis: normal  Intervals: normal  ST/T Wave abnormalities: normal  Conduction Disutrbances: none  Narrative Interpretation:   Old EKG Reviewed: No significant changes noted     Radiology Dg Chest Port 1 View  Result Date: 03/03/2019 CLINICAL DATA:  Chest pain. EXAM: PORTABLE CHEST 1 VIEW COMPARISON:  None. FINDINGS: The heart size and mediastinal contours are within normal limits. Both lungs are clear. The visualized skeletal structures are unremarkable. IMPRESSION: No active disease. Electronically Signed   By: Dorise Bullion III M.D   On: 03/03/2019 14:06    Procedures Procedures (including critical care time)  Medications Ordered in ED Medications  alum & mag hydroxide-simeth (MAALOX/MYLANTA) 200-200-20 MG/5ML suspension 30 mL (30 mLs Oral Given 03/03/19 1344)    And  lidocaine (XYLOCAINE) 2 % viscous mouth solution 15 mL (15 mLs Oral Given 03/03/19 1345)  LORazepam (ATIVAN) injection 1 mg (1 mg Intravenous Given 03/03/19 1346)     Initial Impression / Assessment and Plan / ED Course  I have reviewed the triage vital signs and the nursing notes.  Pertinent labs & imaging results that were available during my care of the patient were reviewed by me and considered in my medical decision  making (see chart for details).        BP (!) 116/59   Pulse 75   Temp 98.5 F (36.9 C) (Oral)   Resp 20   Ht 5\' 5"  (1.651 m)   Wt 117.9 kg   LMP 09/25/2018   SpO2 95%   BMI 43.27 kg/m    Final Clinical Impressions(s) / ED Diagnoses   Final diagnoses:  Atypical chest pain    ED Discharge Orders         Ordered    famotidine (PEPCID) 20 MG tablet  2 times daily     03/03/19 1706         1:34 PM Patient here with chest pain that started early this morning.  Pain is atypical of ACS.  Patient appears to be distraught relating to possibly swallowing something while eating ice cream yesterday.  She is afebrile, vital signs stable, no hypoxia, she is PERC negative therefore low suspicion for PE.  Work-up initiated.   2:59 PM HEART score of 2, low risk of MACE.  Troponin is elevated at 42.  Mildly elevated WBC of 12.2.  Electrolytes panel reassuring.  CXR unremarkable.  Pt was given GI cocktail and ativan and now her pain has resolved.  Plan to obtain delta trop.  Care discussed with DR. Allen.   5:02 PM Although delta trop increased from 42 to 50, pt did not have any active CP.  Dr. Zenia Resides have evaluated pt and felt that this is likely GI causing cp.  Pt stable for discharge with pepcid.  outpt f/u recommended.  Return precaution given.    Domenic Moras, PA-C  03/03/19 1707    Lacretia Leigh, MD 03/08/19 1014

## 2019-03-03 NOTE — ED Provider Notes (Signed)
Medical screening examination/treatment/procedure(s) were conducted as a shared visit with non-physician practitioner(s) and myself.  I personally evaluated the patient during the encounter.  EKG Interpretation  Date/Time:  Friday March 03 2019 13:32:21 EDT Ventricular Rate:  81 PR Interval:    QRS Duration: 85 QT Interval:  374 QTC Calculation: 435 R Axis:   87 Text Interpretation:  Sinus rhythm Confirmed by Lacretia Leigh ([id number]) on 03/03/2019 2:57:48 PM Also confirmed by Lacretia Leigh ([id number])  on 03/03/2019 4:26:12 PM   43 year old female female who presents with epigastric pain after eating a fatty meal.  Patient likely with reflux.  Troponin without significant change.  Patient for discharge   Lacretia Leigh, MD 03/03/19 1704

## 2019-03-03 NOTE — Discharge Instructions (Signed)
Your pain is likely caused by heart burn.  Take pepcid as needed.  Call and follow up with your doctor for further care.  Return if you have any concerns.

## 2019-06-17 ENCOUNTER — Emergency Department (HOSPITAL_COMMUNITY): Payer: Self-pay

## 2019-06-17 ENCOUNTER — Emergency Department (HOSPITAL_COMMUNITY)
Admission: EM | Admit: 2019-06-17 | Discharge: 2019-06-18 | Disposition: A | Discharge status: Home / Self Care | Payer: Self-pay | Attending: Emergency Medicine | Admitting: Emergency Medicine

## 2019-06-17 ENCOUNTER — Other Ambulatory Visit: Payer: Self-pay

## 2019-06-17 ENCOUNTER — Encounter (HOSPITAL_COMMUNITY): Payer: Self-pay

## 2019-06-17 DIAGNOSIS — R0789 Other chest pain: Secondary | ICD-10-CM | POA: Insufficient documentation

## 2019-06-17 DIAGNOSIS — F17210 Nicotine dependence, cigarettes, uncomplicated: Secondary | ICD-10-CM | POA: Insufficient documentation

## 2019-06-17 DIAGNOSIS — Z79899 Other long term (current) drug therapy: Secondary | ICD-10-CM | POA: Insufficient documentation

## 2019-06-17 DIAGNOSIS — Z859 Personal history of malignant neoplasm, unspecified: Secondary | ICD-10-CM | POA: Insufficient documentation

## 2019-06-17 DIAGNOSIS — I10 Essential (primary) hypertension: Secondary | ICD-10-CM | POA: Insufficient documentation

## 2019-06-17 DIAGNOSIS — R519 Headache, unspecified: Secondary | ICD-10-CM | POA: Insufficient documentation

## 2019-06-17 LAB — CBC
HCT: 41.3 % (ref 36.0–46.0)
Hemoglobin: 13.6 g/dL (ref 12.0–15.0)
MCH: 29.2 pg (ref 26.0–34.0)
MCHC: 32.9 g/dL (ref 30.0–36.0)
MCV: 88.6 fL (ref 80.0–100.0)
Platelets: 342 10*3/uL (ref 150–400)
RBC: 4.66 MIL/uL (ref 3.87–5.11)
RDW: 13.4 % (ref 11.5–15.5)
WBC: 9.3 10*3/uL (ref 4.0–10.5)
nRBC: 0 % (ref 0.0–0.2)

## 2019-06-17 LAB — BASIC METABOLIC PANEL
Anion gap: 8 (ref 5–15)
BUN: 14 mg/dL (ref 6–20)
CO2: 24 mmol/L (ref 22–32)
Calcium: 9 mg/dL (ref 8.9–10.3)
Chloride: 105 mmol/L (ref 98–111)
Creatinine, Ser: 0.76 mg/dL (ref 0.44–1.00)
GFR calc Af Amer: >60 mL/min (ref >60)
GFR calc non Af Amer: >60 mL/min (ref >60)
Glucose, Bld: 88 mg/dL (ref 70–99)
Potassium: 3.7 mmol/L (ref 3.5–5.1)
Sodium: 137 mmol/L (ref 135–145)

## 2019-06-17 LAB — I-STAT BETA HCG BLOOD, ED (MC, WL, AP ONLY): I-stat hCG, quantitative: <5 m[IU]/mL (ref <5)

## 2019-06-17 LAB — TROPONIN I (HIGH SENSITIVITY): Troponin I (High Sensitivity): 4 ng/L (ref <18)

## 2019-06-17 NOTE — ED Triage Notes (Signed)
Pt arrives POV for eval of chest pain, headache and htn. Pt reports she has noted a headache x5 days, reports home BP have been reading 180/1teens, endorses hx of same, but states never this high. Pt also reports CP today, along with ongoing HA and HBP. Pt is tearful and extremely anxious in triage.

## 2019-06-18 ENCOUNTER — Emergency Department (HOSPITAL_COMMUNITY): Payer: Self-pay

## 2019-06-18 LAB — TROPONIN I (HIGH SENSITIVITY): Troponin I (High Sensitivity): 4 ng/L (ref <18)

## 2019-06-18 LAB — D-DIMER, QUANTITATIVE: D-Dimer, Quant: 0.45 ug/mL-FEU (ref 0.00–0.50)

## 2019-06-18 MED ORDER — MAGNESIUM SULFATE 2 GM/50ML IV SOLN
2.0000 g | INTRAVENOUS | Status: AC
  Administered 2019-06-18: 2 g via INTRAVENOUS
  Filled 2019-06-18: qty 50

## 2019-06-18 MED ORDER — DIPHENHYDRAMINE HCL 50 MG/ML IJ SOLN
25.0000 mg | Freq: Once | INTRAMUSCULAR | Status: AC
  Administered 2019-06-18: 25 mg via INTRAVENOUS
  Filled 2019-06-18: qty 1

## 2019-06-18 MED ORDER — SODIUM CHLORIDE 0.9 % IV BOLUS
1000.0000 mL | Freq: Once | INTRAVENOUS | Status: AC
  Administered 2019-06-18: 1000 mL via INTRAVENOUS

## 2019-06-18 MED ORDER — METOCLOPRAMIDE HCL 5 MG/ML IJ SOLN
10.0000 mg | Freq: Once | INTRAMUSCULAR | Status: AC
  Administered 2019-06-18: 10 mg via INTRAVENOUS
  Filled 2019-06-18: qty 2

## 2019-06-18 MED ORDER — KETOROLAC TROMETHAMINE 30 MG/ML IJ SOLN
30.0000 mg | Freq: Once | INTRAMUSCULAR | Status: AC
  Administered 2019-06-18: 30 mg via INTRAVENOUS
  Filled 2019-06-18: qty 1

## 2019-06-18 NOTE — Discharge Instructions (Addendum)
Your blood tests and CT scan and chest x-ray all looks normal today.  No evidence of heart attack or blood clot was found.  Regarding her headaches, please follow-up with a neurologist.  Someone from the neurologist office should contact you.

## 2019-06-18 NOTE — ED Provider Notes (Signed)
[facility] EMERGENCY DEPARTMENT Provider Note   CSN: [id number] Arrival date & time: 06/17/19  2029     History   Chief Complaint Chief Complaint  Patient presents with  . Chest Pain  . Hypertension  . Headache    HPI Tracey Lee is a 43 y.o. female.     Patient presents to the emergency department with a chief complaint of headache.  She states she has had a headache for the past 5 days.  She states that it is somewhat intermittent, and goes away when she goes to sleep, but when she wakes back up it gradually worsens.  She does describe an aura like sensation, and what she sees different light patterns.  She has not ever had anything like this before.  She denies any numbness, weakness, or tingling.  Denies any slurred speech.  Denies any vision loss.  She has not tried taking anything for symptoms.  She also complains of left-sided chest pain as well as some bilateral pleuritic pain.  She denies any history of PE or DVT.  The history is provided by the patient. No language interpreter was used.    Past Medical History:  Diagnosis Date  . Anxiety   . Cancer ([place])   . Carpal tunnel syndrome   . Degenerative cervical disc   . Fibromyalgia   . Hypertension     Patient Active Problem List   Diagnosis Date Noted  . CONTACT DERMATITIS DUE TO POISON IVY 03/13/2008  . UTI 03/07/2008    Past Surgical History:  Procedure Laterality Date  . CERVICAL ABLATION    . CHOLECYSTECTOMY    . NECK SURGERY    . scalp surgery       OB History    Gravida  4   Para  2   Term  2   Preterm      AB  1   Living  3     SAB  1   TAB      Ectopic      Multiple      Live Births               Home Medications    Prior to Admission medications   Medication Sig Start Date End Date Taking? Authorizing Provider  famotidine (PEPCID) 20 MG tablet Take 1 tablet (20 mg total) by mouth 2 (two) times daily. 03/03/19   Domenic Moras, PA-C  Multiple  Vitamin (MULTIVITAMIN WITH MINERALS) TABS tablet Take 1 tablet by mouth daily.    [provider]    Family History Family History  Problem Relation Age of Onset  . Heart failure Mother   . Anxiety disorder Mother   . Irregular heart beat Mother   . Diabetes Father   . Cancer Father     Social History Social History   Tobacco Use  . Smoking status: Current Every Day Smoker    Packs/day: 0.50    Types: Cigarettes  . Smokeless tobacco: Never Used  Substance Use Topics  . Alcohol use: No  . Drug use: No     Allergies   Patient has no known allergies.   Review of Systems Review of Systems  All other systems reviewed and are negative.    Physical Exam Updated Vital Signs BP 130/71 (BP Location: Right Arm)   Pulse (!) 58   Temp 98.3 F (36.8 C) (Oral)   Resp 16   Ht 5\' 5"  (1.651 m)  Wt 122.5 kg   SpO2 99%   BMI 44.93 kg/m   Physical Exam Vitals signs and nursing note reviewed.  Constitutional:      General: She is not in acute distress.    Appearance: She is well-developed.  HENT:     Head: Normocephalic and atraumatic.  Eyes:     Conjunctiva/sclera: Conjunctivae normal.  Neck:     Musculoskeletal: Neck supple.  Cardiovascular:     Rate and Rhythm: Normal rate and regular rhythm.     Heart sounds: No murmur.     Comments: Left upper chest wall tenderness Pulmonary:     Effort: Pulmonary effort is normal. No respiratory distress.     Breath sounds: Normal breath sounds.  Abdominal:     Palpations: Abdomen is soft.     Tenderness: There is no abdominal tenderness.  Skin:    General: Skin is warm and dry.  Neurological:     General: No focal deficit present.     Mental Status: She is alert and oriented to person, place, and time.     Comments: CN III-XII intact, speech is clear, no pronator drift, normal finger-to-nose  Psychiatric:        Mood and Affect: Mood normal.        Behavior: Behavior normal.      ED Treatments / Results   Labs (all labs ordered are listed, but only abnormal results are displayed) Labs Reviewed  BASIC METABOLIC PANEL  CBC  I-STAT BETA HCG BLOOD, ED ([facility], [facility], [facility] ONLY)  TROPONIN I (HIGH SENSITIVITY)  TROPONIN I (HIGH SENSITIVITY)    EKG EKG Interpretation  Date/Time:  Saturday June 17 2019 20:39:47 EST Ventricular Rate:  67 PR Interval:  150 QRS Duration: 84 QT Interval:  404 QTC Calculation: 426 R Axis:   66 Text Interpretation: Normal sinus rhythm Normal ECG When compared with ECG of 03/03/2019, No significant change was found Confirmed by Delora Fuel ([id number]) on 06/17/2019 11:10:10 PM   Radiology Dg Chest 2 View  Result Date: 06/17/2019 CLINICAL DATA:  Chest pain. EXAM: CHEST - 2 VIEW COMPARISON:  March 03, 2019 FINDINGS: Stable cardiomegaly. The hila and mediastinum are normal. No pneumothorax. No nodules or masses. No focal infiltrates. Minimal atelectasis in the left base. IMPRESSION: No active cardiopulmonary disease. Electronically Signed   By: Dorise Bullion III M.D   On: 06/17/2019 20:58    Procedures Procedures (including critical care time)  Medications Ordered in ED Medications  ketorolac (TORADOL) 30 MG/ML injection 30 mg (has no administration in time range)  metoCLOPramide (REGLAN) injection 10 mg (has no administration in time range)  diphenhydrAMINE (BENADRYL) injection 25 mg (has no administration in time range)  magnesium sulfate IVPB 2 g 50 mL (has no administration in time range)  sodium chloride 0.9 % bolus 1,000 mL (has no administration in time range)     Initial Impression / Assessment and Plan / ED Course  I have reviewed the triage vital signs and the nursing notes.  Pertinent labs & imaging results that were available during my care of the patient were reviewed by me and considered in my medical decision making (see chart for details).        Patient with headache.  She has had a headache for the past 5 days.  Her headache seems to be a  migraine.  She does have an aura like sensation before it worsens.  She does get some relief when she sleeps.  However, patient has never  had a migraine before.  She has never had headaches like this before.  She is concerned, and would like to have imaging.  I will order a CT.  We will also order headache cocktail.  Patient also has some anterior chest wall pain and some pleuritic chest pain.  She is not hypoxic nor tachycardic.  Her troponins are negative, EKG shows no ischemic changes or concerning arrhythmias.  Her electrolytes and blood counts are normal.  I believe she is low risk for PE, but will check D-dimer.  Patient feels improved with headache cocktail.  Labs are reassuring.  Imaging is reassuring.  Feel the patient is stable for outpatient follow-up.  Final Clinical Impressions(s) / ED Diagnoses   Final diagnoses:  Nonintractable headache, unspecified chronicity pattern, unspecified headache type    ED Discharge Orders         Ordered    Ambulatory referral to Neurology    Comments: An appointment is requested in approximately: 1-2 weeks   06/18/19 0557           Montine Circle, PA-C 06/18/19 0559    Palumbo, April, MD 06/18/19 [phone]

## 2019-06-18 NOTE — ED Notes (Signed)
Patient crying in lobby states "my head is exploding and I have been here five hours and I  want to be seen right now. I have seen others come in and go back." explained they went to fast track and she needed the full workup which meant she had to wait for a room.

## 2019-06-18 NOTE — ED Notes (Signed)
Patient transported to CT 

## 2019-06-18 NOTE — ED Notes (Signed)
Patient verbalizes understanding of discharge instructions. Opportunity for questioning and answers were provided. Armband removed by staff, pt discharged from ED. Ambulated out to lobby  

## 2020-02-12 ENCOUNTER — Ambulatory Visit
Admission: EM | Admit: 2020-02-12 | Discharge: 2020-02-12 | Disposition: A | Discharge status: Home / Self Care | Payer: Self-pay

## 2020-02-12 ENCOUNTER — Encounter: Payer: Self-pay | Admitting: Physician Assistant

## 2020-02-12 DIAGNOSIS — Z760 Encounter for issue of repeat prescription: Secondary | ICD-10-CM

## 2020-02-12 DIAGNOSIS — K0889 Other specified disorders of teeth and supporting structures: Secondary | ICD-10-CM

## 2020-02-12 MED ORDER — AMLODIPINE BESYLATE 10 MG PO TABS: 10.0000 mg | ORAL_TABLET | Freq: Every day | ORAL | 0 refills | Status: DC

## 2020-02-12 MED ORDER — AMOXICILLIN-POT CLAVULANATE 875-125 MG PO TABS: 1.0000 | ORAL_TABLET | Freq: Two times a day (BID) | ORAL | 0 refills | Status: DC

## 2020-02-12 MED ORDER — FUROSEMIDE 20 MG PO TABS: 20.0000 mg | ORAL_TABLET | Freq: Every day | ORAL | 0 refills | Status: DC | PRN

## 2020-02-12 NOTE — Discharge Instructions (Signed)
Dental pain Start Augmentin as directed for dental infection. Continue ibuprofen/tylenol. Follow up with dentist for further treatment and evaluation. If experiencing swelling of the throat, trouble breathing, trouble swallowing, leaning forward to breath, drooling, go to the emergency department for further evaluation.   Medication refill Amlodipine refilled for 60 days. Lasix refilled for 14 tablets, take as needed for leg swelling. Otherwise, do elevation, compression stocking to prevent swelling. Follow up with PCP for further evaluation needed.

## 2020-02-12 NOTE — ED Triage Notes (Signed)
Pt c/o lt lower dental pain and abscess x1wk. Pt requesting refill for amlodipine 10mg  and lasix 40mg . States has a PCP appt 09/28. States was given some from the ER, took last one this am.

## 2020-02-12 NOTE — ED Provider Notes (Signed)
[facility]    CSN: [id number] Arrival date & time: 02/12/20  1735      History   Chief Complaint Chief Complaint  Patient presents with  . Dental Pain    HPI Tracey Lee is a 44 y.o. female.   44 year old female comes in for multiple complains.  1. 1 week history of left lower dental pain that worsened last night. Had facial swelling that has improved. tmax 101, respoinsiuve to antipreytic. Known dental caries. Denies new injury to the tooth. Ibuprofen 800mg  prior to arrival  2. Medication refill On amlodipine 10mg , filled by ED 10/2019 when patient was seen for right leg swelling. At the time, was also given lasix to help with swelling after negative DVT. States has not had lasix for 1 week, but has been taking amlodipine as directed without problems. Still with right leg swelling that worsens throughout the day, but improves at night. No chest pain, shortness of breath, orthopnea. Has PCP appt in 2 months.      Past Medical History:  Diagnosis Date  . Anxiety   . Cancer ([place])   . Carpal tunnel syndrome   . Degenerative cervical disc   . Fibromyalgia   . Hypertension     Patient Active Problem List   Diagnosis Date Noted  . CONTACT DERMATITIS DUE TO POISON IVY 03/13/2008  . UTI 03/07/2008    Past Surgical History:  Procedure Laterality Date  . CERVICAL ABLATION    . CHOLECYSTECTOMY    . NECK SURGERY    . scalp surgery      OB History    Gravida  4   Para  2   Term  2   Preterm      AB  1   Living  3     SAB  1   TAB      Ectopic      Multiple      Live Births               Home Medications    Prior to Admission medications   Medication Sig Start Date End Date Taking? Authorizing Provider  amLODipine (NORVASC) 10 MG tablet Take 1 tablet (10 mg total) by mouth daily. 02/12/20   Tasia Catchings, Mitchell Epling V, PA-C  amoxicillin-clavulanate (AUGMENTIN) 875-125 MG tablet Take 1 tablet by mouth every 12 (twelve) hours. 02/12/20   Tasia Catchings, Ashyla Luth V,  PA-C  famotidine (PEPCID) 20 MG tablet Take 1 tablet (20 mg total) by mouth 2 (two) times daily. 03/03/19   Domenic Moras, PA-C  furosemide (LASIX) 20 MG tablet Take 1-2 tablets (20-40 mg total) by mouth daily as needed. 02/12/20   Tasia Catchings, Daekwon Beswick V, PA-C  Multiple Vitamin (MULTIVITAMIN WITH MINERALS) TABS tablet Take 1 tablet by mouth daily.    [provider]    Family History Family History  Problem Relation Age of Onset  . Heart failure Mother   . Anxiety disorder Mother   . Irregular heart beat Mother   . Diabetes Father   . Cancer Father     Social History Social History   Tobacco Use  . Smoking status: Current Every Day Smoker    Packs/day: 0.50    Types: Cigarettes  . Smokeless tobacco: Never Used  Vaping Use  . Vaping Use: Never used  Substance Use Topics  . Alcohol use: No  . Drug use: No     Allergies   Patient has no known allergies.   Review  of Systems Review of Systems  Reason unable to perform ROS: See HPI as above.     Physical Exam Triage Vital Signs ED Triage Vitals  Enc Vitals Group     BP 02/12/20 1810 120/63     Pulse Rate 02/12/20 1810 66     Resp 02/12/20 1810 18     Temp 02/12/20 1810 98.1 F (36.7 C)     Temp Source 02/12/20 1810 Oral     SpO2 02/12/20 1810 95 %     Weight --      Height --      Head Circumference --      Peak Flow --      Pain Score 02/12/20 1825 9     Pain Loc --      Pain Edu? --      Excl. in [place]? --    No data found.  Updated Vital Signs BP 120/63 (BP Location: Left Arm)   Pulse 66   Temp 98.1 F (36.7 C) (Oral)   Resp 18   SpO2 95%   Physical Exam Constitutional:      General: She is not in acute distress.    Appearance: Normal appearance. She is well-developed. She is not ill-appearing, toxic-appearing or diaphoretic.  HENT:     Head: Normocephalic and atraumatic.     Jaw: No trismus.     Mouth/Throat:     Mouth: Mucous membranes are moist.     Pharynx: Oropharynx is clear. Uvula midline. No  uvula swelling.     Tonsils: No tonsillar exudate.     Comments: Overall poor dental hygiene. Left lower jaw with cavities to molars and tender gumline. No obvious fluctuance felt.  Floor of mouth soft to palpation. No facial swelling.  Eyes:     Conjunctiva/sclera: Conjunctivae normal.     Pupils: Pupils are equal, round, and reactive to light.  Cardiovascular:     Rate and Rhythm: Normal rate and regular rhythm.  Pulmonary:     Effort: Pulmonary effort is normal. No respiratory distress.     Comments: LCTAB Musculoskeletal:     Cervical back: Normal range of motion and neck supple.     Comments: Bilateral 1+ pitting edema. No erythema, warmth.   Skin:    General: Skin is warm and dry.  Neurological:     Mental Status: She is alert and oriented to person, place, and time.      UC Treatments / Results  Labs (all labs ordered are listed, but only abnormal results are displayed) Labs Reviewed - No data to display  EKG   Radiology No results found.  Procedures Procedures (including critical care time)  Medications Ordered in UC Medications - No data to display  Initial Impression / Assessment and Plan / UC Course  I have reviewed the triage vital signs and the nursing notes.  Pertinent labs & imaging results that were available during my care of the patient were reviewed by me and considered in my medical decision making (see chart for details).    1. Dental pain Start antibiotics for possible dental infection. Symptomatic treatment as needed. Discussed with patient symptoms can return if dental problem is not addressed. Follow up with dentist for further evaluation and treatment of dental pain. Resources given. Return precautions given.   2. Medication refill Will refill amlodipine x60 days.  Will refill short course of Lasix to help with pitting edema.  Discussed elevation of leg, compression stockings for symptomatic relief as  well.  Return precautions given.   Otherwise to follow-up with PCP as scheduled for further evaluation and management needed.  Final Clinical Impressions(s) / UC Diagnoses   Final diagnoses:  Pain, dental  Medication refill   ED Prescriptions    Medication Sig Dispense Auth. Provider   amoxicillin-clavulanate (AUGMENTIN) 875-125 MG tablet Take 1 tablet by mouth every 12 (twelve) hours. 14 tablet Lem Peary V, PA-C   amLODipine (NORVASC) 10 MG tablet Take 1 tablet (10 mg total) by mouth daily. 60 tablet Vadis Slabach V, PA-C   furosemide (LASIX) 20 MG tablet Take 1-2 tablets (20-40 mg total) by mouth daily as needed. 14 tablet Ok Edwards, PA-C     PDMP not reviewed this encounter.   Ok Edwards, PA-C 02/13/20 [phone]

## 2020-11-01 ENCOUNTER — Telehealth: Payer: Self-pay

## 2020-11-01 ENCOUNTER — Ambulatory Visit
Admission: EM | Admit: 2020-11-01 | Discharge: 2020-11-01 | Disposition: A | Discharge status: Home / Self Care | Payer: Self-pay | Attending: Family Medicine | Admitting: Family Medicine

## 2020-11-01 ENCOUNTER — Other Ambulatory Visit: Payer: Self-pay

## 2020-11-01 DIAGNOSIS — I10 Essential (primary) hypertension: Secondary | ICD-10-CM

## 2020-11-01 DIAGNOSIS — K0889 Other specified disorders of teeth and supporting structures: Secondary | ICD-10-CM

## 2020-11-01 MED ORDER — AMLODIPINE BESYLATE 10 MG PO TABS: 10.0000 mg | ORAL_TABLET | Freq: Every day | ORAL | 1 refills | Status: DC

## 2020-11-01 MED ORDER — AMOXICILLIN-POT CLAVULANATE 875-125 MG PO TABS: 1.0000 | ORAL_TABLET | Freq: Two times a day (BID) | ORAL | 0 refills | Status: DC

## 2020-11-01 MED ORDER — LIDOCAINE VISCOUS HCL 2 % MT SOLN: 10.0000 mL | OROMUCOSAL | 0 refills | Status: AC | PRN

## 2020-11-01 MED ORDER — AMOXICILLIN-POT CLAVULANATE 875-125 MG PO TABS: 1.0000 | ORAL_TABLET | Freq: Two times a day (BID) | ORAL | 0 refills | Status: AC

## 2020-11-01 MED ORDER — AMLODIPINE BESYLATE 10 MG PO TABS: 10.0000 mg | ORAL_TABLET | Freq: Every day | ORAL | 1 refills | Status: AC

## 2020-11-01 MED ORDER — LIDOCAINE VISCOUS HCL 2 % MT SOLN: 10.0000 mL | OROMUCOSAL | 0 refills | Status: DC | PRN

## 2020-11-01 NOTE — ED Triage Notes (Addendum)
Pt c/o toothache to lt bottom teeth x5 days. States in process of getting her teeth pulled. Pt requesting refill for her amlodipine 10mg s. States has been out for months.

## 2020-11-03 NOTE — ED Provider Notes (Signed)
[facility]    CSN: [id number] Arrival date & time: 11/01/20  1749      History   Chief Complaint Chief Complaint  Patient presents with  . Dental Pain    HPI Tracey Lee is a 45 y.o. female.   Patient presenting today with 5-day history of acutely worsening left lower molar pain, swelling.  She states she is in the process of having all of her teeth pulled to get implants and is having to wait a few weeks to get this particular area pulled.  The pain is severe and she is now starting to have some facial swelling and pain radiating toward ear.  Denies fever, chills, chest pain, shortness of breath, drainage from the area.  Using over-the-counter pain relievers and salt water gargles with minimal relief.  She also states she has been out of her amlodipine 10 mg tablets for several months as she has between PCPs and insurances right now.  She states her home blood pressure readings have been significantly high since the dental pain, but she has not had dizziness visual changes headaches or chest pain.  She tolerated the amlodipine very well when she was on it and had blood pressure readings in the 140s to 150s over 80s to 90s while taking it.  She is working on getting her insurance to go through so she can get a new primary care provider.     Past Medical History:  Diagnosis Date  . Anxiety   . Cancer ([place])   . Carpal tunnel syndrome   . Degenerative cervical disc   . Fibromyalgia   . Hypertension     Patient Active Problem List   Diagnosis Date Noted  . CONTACT DERMATITIS DUE TO POISON IVY 03/13/2008  . UTI 03/07/2008    Past Surgical History:  Procedure Laterality Date  . CERVICAL ABLATION    . CHOLECYSTECTOMY    . NECK SURGERY    . scalp surgery      OB History    Gravida  4   Para  2   Term  2   Preterm      AB  1   Living  3     SAB  1   IAB      Ectopic      Multiple      Live Births               Home Medications     Prior to Admission medications   Medication Sig Start Date End Date Taking? Authorizing Provider  amLODipine (NORVASC) 10 MG tablet Take 1 tablet (10 mg total) by mouth daily. 11/01/20   Volney American, PA-C  amoxicillin-clavulanate (AUGMENTIN) 875-125 MG tablet Take 1 tablet by mouth every 12 (twelve) hours. 11/01/20   Volney American, PA-C  famotidine (PEPCID) 20 MG tablet Take 1 tablet (20 mg total) by mouth 2 (two) times daily. 03/03/19   Domenic Moras, PA-C  lidocaine (XYLOCAINE) 2 % solution Use as directed 10 mLs in the mouth or throat as needed for mouth pain. 11/01/20   Volney American, PA-C  Multiple Vitamin (MULTIVITAMIN WITH MINERALS) TABS tablet Take 1 tablet by mouth daily.    [provider]    Family History Family History  Problem Relation Age of Onset  . Heart failure Mother   . Anxiety disorder Mother   . Irregular heart beat Mother   . Diabetes Father   . Cancer Father  Social History Social History   Tobacco Use  . Smoking status: Current Every Day Smoker    Packs/day: 0.50    Types: Cigarettes  . Smokeless tobacco: Never Used  Vaping Use  . Vaping Use: Never used  Substance Use Topics  . Alcohol use: No  . Drug use: No     Allergies   Patient has no known allergies.   Review of Systems Review of Systems Per HPI  Physical Exam Triage Vital Signs ED Triage Vitals  Enc Vitals Group     BP 11/01/20 1800 (!) 196/127     Pulse Rate 11/01/20 1800 (!) 103     Resp 11/01/20 1800 (!) 22     Temp 11/01/20 1800 98.5 F (36.9 C)     Temp Source 11/01/20 1800 Oral     SpO2 11/01/20 1800 97 %     Weight --      Height --      Head Circumference --      Peak Flow --      Pain Score 11/01/20 1802 10     Pain Loc --      Pain Edu? --      Excl. in [place]? --    No data found.  Updated Vital Signs BP (!) 196/127 (BP Location: Left Arm)   Pulse (!) 103   Temp 98.5 F (36.9 C) (Oral)   Resp (!) 22   SpO2 97%    Visual Acuity Right Eye Distance:   Left Eye Distance:   Bilateral Distance:    Right Eye Near:   Left Eye Near:    Bilateral Near:     Physical Exam Vitals and nursing note reviewed.  Constitutional:      Appearance: Normal appearance. She is not ill-appearing.  HENT:     Head: Atraumatic.     Mouth/Throat:     Mouth: Mucous membranes are moist.     Comments: Poor dentition diffusely, several portions of mouth with teeth already removed.  Gingival erythema and edema left lower jaw at molars Eyes:     Extraocular Movements: Extraocular movements intact.     Conjunctiva/sclera: Conjunctivae normal.  Cardiovascular:     Rate and Rhythm: Normal rate and regular rhythm.     Heart sounds: Normal heart sounds.  Pulmonary:     Effort: Pulmonary effort is normal.     Breath sounds: Normal breath sounds.  Musculoskeletal:        General: Normal range of motion.     Cervical back: Normal range of motion and neck supple.  Skin:    General: Skin is warm and dry.  Neurological:     Mental Status: She is alert and oriented to person, place, and time. Mental status is at baseline.     Cranial Nerves: No cranial nerve deficit.     Gait: Gait normal.  Psychiatric:        Judgment: Judgment normal.     Comments: Anxious, tearful      UC Treatments / Results  Labs (all labs ordered are listed, but only abnormal results are displayed) Labs Reviewed - No data to display  EKG   Radiology No results found.  Procedures Procedures (including critical care time)  Medications Ordered in UC Medications - No data to display  Initial Impression / Assessment and Plan / UC Course  I have reviewed the triage vital signs and the nursing notes.  Pertinent labs & imaging results that were available during my  care of the patient were reviewed by me and considered in my medical decision making (see chart for details).     We will start Augmentin and viscous lidocaine for her dental  pain until she can get into the dentist for her extraction procedures.  Salt water gargles, good dental hygiene reviewed.  Over-the-counter pain relievers as needed additionally.  Hypertensive urgency today she has been off of her medications, she is asymptomatic and do believe that partially the elevation is related to her uncontrolled dental pain.  She declines going to the ED for further evaluation of her elevated blood pressures today as it has been this way for several weeks now.  She wishes to restart her medication and continue monitoring home blood pressures and symptoms.  She knows to go to the ED if having chest pain, shortness of breath, dizziness, visual changes, headaches.  Follow-up with primary care soon as possible.  Final Clinical Impressions(s) / UC Diagnoses   Final diagnoses:  Essential hypertension  Pain, dental   Discharge Instructions   None    ED Prescriptions    Medication Sig Dispense Auth. Provider   amLODipine (NORVASC) 10 MG tablet Take 1 tablet (10 mg total) by mouth daily. 90 tablet Volney American, [place]   lidocaine (XYLOCAINE) 2 % solution Use as directed 10 mLs in the mouth or throat as needed for mouth pain. 100 mL Volney American, PA-C   amoxicillin-clavulanate (AUGMENTIN) 875-125 MG tablet Take 1 tablet by mouth every 12 (twelve) hours. 14 tablet Volney American, [place]     PDMP not reviewed this encounter.   Merrie Roof [place], [place] 11/03/20 [phone]

## 2023-06-22 DIAGNOSIS — G9341 Metabolic encephalopathy: Secondary | ICD-10-CM
